# Patient Record
Sex: Male | Born: 1935 | Race: Black or African American | Hispanic: No | Smoking: Never smoker
Health system: Southern US, Community
[De-identification: ages and names within clinical notes are randomized; demographics above are authoritative.]

## PROBLEM LIST (undated history)

## (undated) DIAGNOSIS — E119 Type 2 diabetes mellitus without complications: Secondary | ICD-10-CM

## (undated) DIAGNOSIS — I10 Essential (primary) hypertension: Secondary | ICD-10-CM

## (undated) DIAGNOSIS — C801 Malignant (primary) neoplasm, unspecified: Secondary | ICD-10-CM

## (undated) DIAGNOSIS — I2510 Atherosclerotic heart disease of native coronary artery without angina pectoris: Secondary | ICD-10-CM

## (undated) DIAGNOSIS — E7800 Pure hypercholesterolemia, unspecified: Secondary | ICD-10-CM

## (undated) HISTORY — PX: CORONARY ARTERY BYPASS GRAFT: SHX141

---

## 2016-09-06 DIAGNOSIS — C61 Malignant neoplasm of prostate: Secondary | ICD-10-CM | POA: Diagnosis present

## 2016-09-07 DIAGNOSIS — C221 Intrahepatic bile duct carcinoma: Secondary | ICD-10-CM | POA: Diagnosis present

## 2016-12-17 ENCOUNTER — Observation Stay (HOSPITAL_BASED_OUTPATIENT_CLINIC_OR_DEPARTMENT_OTHER): Payer: Medicare Other

## 2016-12-17 ENCOUNTER — Encounter (HOSPITAL_BASED_OUTPATIENT_CLINIC_OR_DEPARTMENT_OTHER): Payer: Self-pay | Admitting: Emergency Medicine

## 2016-12-17 ENCOUNTER — Inpatient Hospital Stay (HOSPITAL_BASED_OUTPATIENT_CLINIC_OR_DEPARTMENT_OTHER)
Admission: EM | Admit: 2016-12-17 | Discharge: 2016-12-21 | DRG: 180 | Disposition: A | Discharge status: Skilled Nursing Facility | Payer: Medicare Other | Attending: Internal Medicine | Admitting: Internal Medicine

## 2016-12-17 ENCOUNTER — Emergency Department (HOSPITAL_BASED_OUTPATIENT_CLINIC_OR_DEPARTMENT_OTHER): Payer: Medicare Other

## 2016-12-17 DIAGNOSIS — E1100 Type 2 diabetes mellitus with hyperosmolarity without nonketotic hyperglycemic-hyperosmolar coma (NKHHC): Secondary | ICD-10-CM | POA: Diagnosis present

## 2016-12-17 DIAGNOSIS — Z681 Body mass index (BMI) 19 or less, adult: Secondary | ICD-10-CM

## 2016-12-17 DIAGNOSIS — R0602 Shortness of breath: Secondary | ICD-10-CM

## 2016-12-17 DIAGNOSIS — N183 Chronic kidney disease, stage 3 unspecified: Secondary | ICD-10-CM | POA: Diagnosis present

## 2016-12-17 DIAGNOSIS — R627 Adult failure to thrive: Secondary | ICD-10-CM | POA: Diagnosis present

## 2016-12-17 DIAGNOSIS — Z791 Long term (current) use of non-steroidal anti-inflammatories (NSAID): Secondary | ICD-10-CM

## 2016-12-17 DIAGNOSIS — I2583 Coronary atherosclerosis due to lipid rich plaque: Secondary | ICD-10-CM | POA: Diagnosis present

## 2016-12-17 DIAGNOSIS — C7801 Secondary malignant neoplasm of right lung: Secondary | ICD-10-CM | POA: Diagnosis not present

## 2016-12-17 DIAGNOSIS — I129 Hypertensive chronic kidney disease with stage 1 through stage 4 chronic kidney disease, or unspecified chronic kidney disease: Secondary | ICD-10-CM | POA: Diagnosis present

## 2016-12-17 DIAGNOSIS — R0609 Other forms of dyspnea: Secondary | ICD-10-CM | POA: Diagnosis not present

## 2016-12-17 DIAGNOSIS — I2510 Atherosclerotic heart disease of native coronary artery without angina pectoris: Secondary | ICD-10-CM | POA: Diagnosis present

## 2016-12-17 DIAGNOSIS — R918 Other nonspecific abnormal finding of lung field: Secondary | ICD-10-CM

## 2016-12-17 DIAGNOSIS — D631 Anemia in chronic kidney disease: Secondary | ICD-10-CM | POA: Diagnosis present

## 2016-12-17 DIAGNOSIS — R413 Other amnesia: Secondary | ICD-10-CM | POA: Diagnosis present

## 2016-12-17 DIAGNOSIS — E43 Unspecified severe protein-calorie malnutrition: Secondary | ICD-10-CM | POA: Diagnosis present

## 2016-12-17 DIAGNOSIS — Z515 Encounter for palliative care: Secondary | ICD-10-CM | POA: Diagnosis not present

## 2016-12-17 DIAGNOSIS — Z79899 Other long term (current) drug therapy: Secondary | ICD-10-CM

## 2016-12-17 DIAGNOSIS — R531 Weakness: Secondary | ICD-10-CM

## 2016-12-17 DIAGNOSIS — C240 Malignant neoplasm of extrahepatic bile duct: Secondary | ICD-10-CM | POA: Diagnosis present

## 2016-12-17 DIAGNOSIS — C7802 Secondary malignant neoplasm of left lung: Secondary | ICD-10-CM | POA: Diagnosis present

## 2016-12-17 DIAGNOSIS — Z801 Family history of malignant neoplasm of trachea, bronchus and lung: Secondary | ICD-10-CM

## 2016-12-17 DIAGNOSIS — E1122 Type 2 diabetes mellitus with diabetic chronic kidney disease: Secondary | ICD-10-CM | POA: Diagnosis present

## 2016-12-17 DIAGNOSIS — Z7984 Long term (current) use of oral hypoglycemic drugs: Secondary | ICD-10-CM

## 2016-12-17 DIAGNOSIS — I10 Essential (primary) hypertension: Secondary | ICD-10-CM | POA: Diagnosis present

## 2016-12-17 DIAGNOSIS — C61 Malignant neoplasm of prostate: Secondary | ICD-10-CM | POA: Diagnosis present

## 2016-12-17 DIAGNOSIS — C23 Malignant neoplasm of gallbladder: Secondary | ICD-10-CM | POA: Diagnosis present

## 2016-12-17 DIAGNOSIS — Z951 Presence of aortocoronary bypass graft: Secondary | ICD-10-CM

## 2016-12-17 DIAGNOSIS — D649 Anemia, unspecified: Secondary | ICD-10-CM | POA: Diagnosis present

## 2016-12-17 DIAGNOSIS — Z87891 Personal history of nicotine dependence: Secondary | ICD-10-CM

## 2016-12-17 DIAGNOSIS — Z7982 Long term (current) use of aspirin: Secondary | ICD-10-CM

## 2016-12-17 DIAGNOSIS — E7800 Pure hypercholesterolemia, unspecified: Secondary | ICD-10-CM | POA: Diagnosis present

## 2016-12-17 DIAGNOSIS — J439 Emphysema, unspecified: Secondary | ICD-10-CM | POA: Diagnosis present

## 2016-12-17 DIAGNOSIS — R778 Other specified abnormalities of plasma proteins: Secondary | ICD-10-CM | POA: Diagnosis present

## 2016-12-17 DIAGNOSIS — C221 Intrahepatic bile duct carcinoma: Secondary | ICD-10-CM | POA: Diagnosis present

## 2016-12-17 DIAGNOSIS — R7989 Other specified abnormal findings of blood chemistry: Secondary | ICD-10-CM

## 2016-12-17 HISTORY — DX: Type 2 diabetes mellitus without complications: E11.9

## 2016-12-17 HISTORY — DX: Pure hypercholesterolemia, unspecified: E78.00

## 2016-12-17 HISTORY — DX: Atherosclerotic heart disease of native coronary artery without angina pectoris: I25.10

## 2016-12-17 HISTORY — DX: Essential (primary) hypertension: I10

## 2016-12-17 HISTORY — DX: Malignant (primary) neoplasm, unspecified: C80.1

## 2016-12-17 LAB — CBC WITH DIFFERENTIAL/PLATELET
BASOS ABS: 0 10*3/uL (ref 0.0–0.1)
Basophils Relative: 0 %
Eosinophils Absolute: 0.3 10*3/uL (ref 0.0–0.7)
Eosinophils Relative: 5 %
HEMATOCRIT: 31.8 % — AB (ref 39.0–52.0)
Hemoglobin: 10.2 g/dL — ABNORMAL LOW (ref 13.0–17.0)
LYMPHS PCT: 15 %
Lymphs Abs: 1 10*3/uL (ref 0.7–4.0)
MCH: 29.4 pg (ref 26.0–34.0)
MCHC: 32.1 g/dL (ref 30.0–36.0)
MCV: 91.6 fL (ref 78.0–100.0)
MONO ABS: 0.6 10*3/uL (ref 0.1–1.0)
Monocytes Relative: 8 %
NEUTROS ABS: 5.1 10*3/uL (ref 1.7–7.7)
Neutrophils Relative %: 72 %
Platelets: 158 10*3/uL (ref 150–400)
RBC: 3.47 MIL/uL — AB (ref 4.22–5.81)
RDW: 14.1 % (ref 11.5–15.5)
WBC: 7 10*3/uL (ref 4.0–10.5)

## 2016-12-17 LAB — COMPREHENSIVE METABOLIC PANEL
ALBUMIN: 3.5 g/dL (ref 3.5–5.0)
ALT: 40 U/L (ref 17–63)
AST: 46 U/L — AB (ref 15–41)
Alkaline Phosphatase: 97 U/L (ref 38–126)
Anion gap: 11 (ref 5–15)
BUN: 24 mg/dL — AB (ref 6–20)
CHLORIDE: 95 mmol/L — AB (ref 101–111)
CO2: 28 mmol/L (ref 22–32)
Calcium: 9.3 mg/dL (ref 8.9–10.3)
Creatinine, Ser: 1.24 mg/dL (ref 0.61–1.24)
GFR calc Af Amer: 58 mL/min — ABNORMAL LOW (ref >60)
GFR, EST NON AFRICAN AMERICAN: 50 mL/min — AB (ref >60)
Glucose, Bld: 263 mg/dL — ABNORMAL HIGH (ref 65–99)
POTASSIUM: 4.7 mmol/L (ref 3.5–5.1)
SODIUM: 134 mmol/L — AB (ref 135–145)
Total Bilirubin: 0.5 mg/dL (ref 0.3–1.2)
Total Protein: 7.7 g/dL (ref 6.5–8.1)

## 2016-12-17 LAB — BASIC METABOLIC PANEL
BUN: 24 — AB (ref 4–21)
Creatinine: 1.2 (ref 0.6–1.3)
Glucose: 263
Potassium: 4.7 (ref 3.4–5.3)
Sodium: 134 — AB (ref 137–147)

## 2016-12-17 LAB — BRAIN NATRIURETIC PEPTIDE: B Natriuretic Peptide: 87.3 pg/mL (ref 0.0–100.0)

## 2016-12-17 LAB — CBG MONITORING, ED: GLUCOSE-CAPILLARY: 257 mg/dL — AB (ref 65–99)

## 2016-12-17 LAB — TROPONIN I: Troponin I: 0.1 ng/mL (ref <0.03)

## 2016-12-17 LAB — CBC AND DIFFERENTIAL
HCT: 32 — AB (ref 41–53)
HEMOGLOBIN: 10.2 — AB (ref 13.5–17.5)
PLATELETS: 158 (ref 150–399)
WBC: 7

## 2016-12-17 LAB — HEPATIC FUNCTION PANEL: BILIRUBIN, TOTAL: 0.5

## 2016-12-17 LAB — LIPASE, BLOOD: LIPASE: 110 U/L — AB (ref 11–51)

## 2016-12-17 LAB — GLUCOSE, CAPILLARY: GLUCOSE-CAPILLARY: 152 mg/dL — AB (ref 65–99)

## 2016-12-17 MED ORDER — IOPAMIDOL (ISOVUE-300) INJECTION 61%
100.0000 mL | Freq: Once | INTRAVENOUS | Status: AC | PRN
  Administered 2016-12-17: 100 mL via INTRAVENOUS

## 2016-12-17 MED ORDER — SODIUM CHLORIDE 0.9 % IV BOLUS (SEPSIS)
500.0000 mL | Freq: Once | INTRAVENOUS | Status: AC
  Administered 2016-12-17: 500 mL via INTRAVENOUS

## 2016-12-17 MED ORDER — ASPIRIN 81 MG PO CHEW
324.0000 mg | CHEWABLE_TABLET | Freq: Once | ORAL | Status: AC
  Administered 2016-12-17: 324 mg via ORAL
  Filled 2016-12-17: qty 4

## 2016-12-17 NOTE — ED Triage Notes (Signed)
Pt sent from UC for possible pneumonia. Caregivers advise pt has exertional SOB x several days, denies fever. Also advise pt blood sugars have been high.

## 2016-12-17 NOTE — Progress Notes (Signed)
83 yo M with CAD s/p CABG ~2000, HTN, NIDDM, prostate cancer, recently rising PSA and in last 6 months new dx cholangiocarcinoma who presents with weakness.  In early May, patient saw his Onc in [place] (this is from [place]) and declined chemo, said he wanted to be with family in [place].  Since has moved here, established with Dr. Morene Rankins?  In last few days, family have noticed he is more tired, weak, confused.  Today brought him in.  BP 100/77   Pulse (!) 58   Temp 97.9 F (36.6 C) (Oral)   Resp 18   SpO2 96%   Na 134, K 4.7, Cr 1.2, Glucose 263, AST 46, ALT normal, WBC 7K, Hgb 10.2 Lipase 110 Troponin 0.1 BNP normal  No real focal symptoms.  D/w Cardiology who rec'd medical admission and observation at [facility] in case troponin rises.  Likely no invasive strategy given age, cancer.  ED will get CT abd before transfer.    To tele, OBS status.

## 2016-12-17 NOTE — ED Notes (Addendum)
Pt's family reports pt had recent move from [place] to [place] due to bile duct cancer dx. Family reports that patient has been in [place] x 2 weeks. Sts pt becomes short of breath and weak after brushing teeth and hair. Sts that pt has been more confused recently as well. Also reports trouble with blood sugars. Sts that today it has been approximately 400 prior to going to Urgent Care. Pt diagnosed with pneumonia at Urgent Care and sent to ED for further eval. Pt A&O x 4 at this time. Family reports pt recent Hgb of 5 in [place] has been giving patient iron to try to build up blood counts.

## 2016-12-17 NOTE — ED Notes (Signed)
Pts daughtercalled at his request and informed of plans to admit to [place] at [facility].

## 2016-12-17 NOTE — ED Provider Notes (Signed)
Emergency Department Provider Note  By signing my name below, I, Marcello Moores, attest that this documentation has been prepared under the direction and in the presence of Long, Wonda Olds, MD. Electronically Signed: Marcello Moores, ED Scribe. 12/17/16. 7:08 PM.  I have reviewed the triage vital signs and the nursing notes.   HISTORY  Chief Complaint Shortness of Breath  HPI Comments: Willie Bruce is a 83 y.o. male, previously seen at [facility], who presents to the Emergency Department complaining of constant, severe SOB with associated confusion that began worsening two days ago. Per daughter, the SOB causes him discomfort when he's performing regular tasks like brushing his teeth and hair. The pt also complains of chronic hypertension. He indicates that he recently moved here from [place], where he was diagnosed with prostate and stomach cancer by [facility] at [facility]. He has yet to undergo chemotherapy and radiation. Per daughter, the pt had one blood transfusion due to his triple heart bypass surgery from 4 weeks ago. The pt denies being on any blood thinners. The pt denies chest pain, abdominal pain, melena, chills, and hematochezia.   Past Medical History:  Diagnosis Date  . Cancer ([place])   . Coronary artery disease   . Diabetes mellitus without complication ([place])   . High cholesterol   . Hypertension     Patient Active Problem List   Diagnosis Date Noted  . Atypical pneumonia 12/18/2016  . CKD (chronic kidney disease), stage III 12/18/2016  . Normocytic anemia 12/18/2016  . Elevated troponin 12/18/2016  . Type 2 diabetes mellitus with hyperosmolarity without coma, without long-term current use of insulin ([place]) 12/18/2016  . Essential hypertension 12/18/2016  . Coronary artery disease due to lipid rich plaque 12/18/2016  . Weakness 12/17/2016  . Cholangiocarcinoma ([place]) 09/07/2016  . Prostate cancer ([place]) 09/06/2016    Past Surgical History:   Procedure Laterality Date  . CORONARY ARTERY BYPASS GRAFT        Allergies Patient has no known allergies.  Family History  Problem Relation Age of Onset  . Lung cancer Sister     Social History Social History  Substance Use Topics  . Smoking status: Never Smoker  . Smokeless tobacco: Never Used  . Alcohol use Not on file    Review of Systems  Constitutional: No fever/chills Eyes: No visual changes. ENT: No sore throat. Cardiovascular: Denies chest pain. Respiratory: Positive shortness of breath. Gastrointestinal: No abdominal pain.  No nausea, no vomiting.  No diarrhea.  No constipation. No melena. No hematochezia.  Genitourinary: Negative for dysuria. Musculoskeletal: Negative for back pain. Skin: Negative for rash. Neurological: Negative for headaches, focal weakness or numbness. Positive for confusion.  10-point ROS otherwise negative.  ____________________________________________   PHYSICAL EXAM:  VITAL SIGNS: ED Triage Vitals  Enc Vitals Group     BP 12/17/16 1808 (!) 141/62     Pulse Rate 12/17/16 1808 62     Resp 12/17/16 1808 (!) 24     Temp 12/17/16 1808 97.9 F (36.6 C)     Temp Source 12/17/16 1808 Oral     SpO2 12/17/16 1808 99 %     Pain Score 12/17/16 1807 0   Constitutional: Alert and oriented. Well appearing and in no acute distress. Eyes: Conjunctivae are normal. Head: Atraumatic. Nose: No congestion/rhinnorhea. Mouth/Throat: Mucous membranes are moist.  Oropharynx non-erythematous. Neck: No stridor.  Cardiovascular: Normal rate, regular rhythm. Good peripheral circulation. Grossly normal heart sounds.   Respiratory: Normal respiratory effort.  No retractions. Lungs CTAB. Gastrointestinal: Soft and nontender. No distention.  Musculoskeletal: No lower extremity tenderness nor edema. No gross deformities of extremities. Neurologic:  Normal speech and language. No gross focal neurologic deficits are appreciated.  Skin:  Skin is warm,  dry and intact. No rash noted.  ____________________________________________  DIAGNOSTIC STUDIES: Oxygen Saturation is 99% on RA, normal by my interpretation.   COORDINATION OF CARE: 6:50 PM-Discussed next steps with pt. Pt verbalized understanding and is agreeable with the plan.    LABS (all labs ordered are listed, but only abnormal results are displayed)  Labs Reviewed  COMPREHENSIVE METABOLIC PANEL - Abnormal; Notable for the following:       Result Value   Sodium 134 (*)    Chloride 95 (*)    Glucose, Bld 263 (*)    BUN 24 (*)    AST 46 (*)    GFR calc non Af Amer 50 (*)    GFR calc Af Amer 58 (*)    All other components within normal limits  LIPASE, BLOOD - Abnormal; Notable for the following:    Lipase 110 (*)    All other components within normal limits  CBC WITH DIFFERENTIAL/PLATELET - Abnormal; Notable for the following:    RBC 3.47 (*)    Hemoglobin 10.2 (*)    HCT 31.8 (*)    All other components within normal limits  TROPONIN I - Abnormal; Notable for the following:    Troponin I 0.10 (*)    All other components within normal limits  GLUCOSE, CAPILLARY - Abnormal; Notable for the following:    Glucose-Capillary 152 (*)    All other components within normal limits  TROPONIN I - Abnormal; Notable for the following:    Troponin I 0.09 (*)    All other components within normal limits  TROPONIN I - Abnormal; Notable for the following:    Troponin I 0.08 (*)    All other components within normal limits  CBC - Abnormal; Notable for the following:    RBC 3.19 (*)    Hemoglobin 8.9 (*)    HCT 28.4 (*)    Platelets 131 (*)    All other components within normal limits  GLUCOSE, CAPILLARY - Abnormal; Notable for the following:    Glucose-Capillary 203 (*)    All other components within normal limits  URINALYSIS, ROUTINE W REFLEX MICROSCOPIC - Abnormal; Notable for the following:    Glucose, UA 500 (*)    All other components within normal limits  CBG  MONITORING, ED - Abnormal; Notable for the following:    Glucose-Capillary 257 (*)    All other components within normal limits  BRAIN NATRIURETIC PEPTIDE  URINALYSIS, ROUTINE W REFLEX MICROSCOPIC  STREP PNEUMONIAE URINARY ANTIGEN  LEGIONELLA PNEUMOPHILA SEROGP 1 UR AG   ____________________________________________  EKG   EKG Interpretation  Date/Time:  Sunday December 17 2016 18:14:27 EDT Ventricular Rate:  62 PR Interval:    QRS Duration: 110 QT Interval:  425 QTC Calculation: 432 R Axis:   87 Text Interpretation:  Sinus rhythm Borderline right axis deviation No STEMI. No old for comparison.  Confirmed by Nanda Quinton [phone]) on 12/17/2016 6:40:32 PM       ____________________________________________  RADIOLOGY  Dg Chest 2 View  Result Date: 12/17/2016 CLINICAL DATA:  Exertional shortness breath for several days. EXAM: CHEST  2 VIEW COMPARISON:  None. FINDINGS: Normal sized heart. Post CABG changes. Mild patchy opacities scattered throughout the right lung.a mall poorly defined nodular  opacity in the left mid lung zone. Unremarkable bones. IMPRESSION: Mild patchy opacities in the right lung and small poorly defined nodular opacity in the left mid lung zone. These could represent infectious or post infectious changes. A chest CT with contrast may provide useful additional information. Electronically Signed   By: Claudie Revering M.D.   On: 12/17/2016 21:41   Ct Abdomen Pelvis W Contrast  Result Date: 12/17/2016 CLINICAL DATA:  Exertional shortness of breath, history of recently diagnosed cholangiocarcinoma EXAM: CT ABDOMEN AND PELVIS WITH CONTRAST TECHNIQUE: Multidetector CT imaging of the abdomen and pelvis was performed using the standard protocol following bolus administration of intravenous contrast. CONTRAST:  114mL ISOVUE-300 IOPAMIDOL (ISOVUE-300) INJECTION 61% COMPARISON:  None. FINDINGS: Lower chest: Lung bases demonstrate multiple, greater than 6, peripherally based nodules  within the right lower lobe, right middle lobe, lingula and left lower lobe. No pleural effusion or focal consolidation is seen. The heart is nonenlarged. Coronary artery calcifications. Hepatobiliary: Vague hypodense somewhat branching appearing structures in the right hepatic lobe. No focal mass is seen. Pneumobilia a with presence of biliary stent. Fluid or debris in the distal portion of the stent at the duodenum. Small air in the gallbladder. No calcified stones. Ill-defined soft tissue thickening/hypodensity at the distal portion of biliary stent Pancreas: Pancreatic duct is enlarged. Slight bloody appearance at the head of pancreas and second portion of duodenum Spleen: Multiple hypodense subcentimeter splenic lesions. Adrenals/Urinary Tract: Adrenal glands are within normal limits. The kidneys show no hydronephrosis. Cyst upper pole left kidney measuring 2.5 cm. Possible stones versus cortical calcifications in the lower pole of the left kidney. 15 mm cyst lower pole of the right kidney. Parapelvic cysts in the upper pole of the right kidney. Bladder unremarkable. Stomach/Bowel: Stomach is nonenlarged. No dilated small bowel. No colon wall thickening. Normal appendix. Vascular/Lymphatic: Extensive atherosclerotic calcifications of the aorta. 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 16 mm peripancreatic lymph node. No significantly enlarged pelvic nodes. Reproductive: Enlarged prostate with mass effect on the posterior bladder. Metallic densities at the prostate gland. Other: No free air or free fluid.  Small fat in the umbilicus. Musculoskeletal: 11 mm anterolisthesis of L4 on L5 with fusion of the vertebral bodies. Bilateral pars defect at L4. Retrolisthesis of L5 on S1 with degenerative changes. No suspicious bone lesion. IMPRESSION: 1. Multiple subpleural bilateral lower lobe, right middle lobe and lingular pulmonary nodules suspicious for respiratory infection or atypical  pneumonia. Metastatic nodules also possible given history. Non-contrast chest CT at 3-6 months is recommended. If the nodules are stable at time of repeat CT, then future CT at 18-24 months (from today's scan) is considered optional for low-risk patients, but is recommended for high-risk patients. This recommendation follows the consensus statement: Guidelines for Management of Incidental Pulmonary Nodules Detected on CT Images: From the Fleischner Society 2017; Radiology 2017; 284:228-243. 2. Pneumobilia with presence of biliary stent. Small amount of debris or tissue in the distal portion of the stent. Ill-defined hypodensity around the distal stent/head of pancreas, which may relate to history of cholangiocarcinoma. Pancreatic duct is enlarged. 3. Vague peripheral possibly branching hypodensities mostly in the right hepatic lobe, could relate to distal ductal dilatation. 4. Subcentimeter splenic lesions too small to further characterize. 5. Aneurysmal dilatation of the iliac arteries, on the right up to 2.9 cm and on the left up to 1.9 cm. 6. Enlarged heterogenous prostate gland with mass effect on the posterior bladder. 7. 11 mm anterolisthesis of  L4 on L5 with bilateral pars defect at L4 Electronically Signed   By: Donavan Foil M.D.   On: 12/17/2016 22:14    ____________________________________________   PROCEDURES  Procedure(s) performed:   Procedures  None ____________________________________________   INITIAL IMPRESSION / ASSESSMENT AND PLAN / ED COURSE  Pertinent labs & imaging results that were available during my care of the patient were reviewed by me and considered in my medical decision making (see chart for details).  Patient presents to the emergency department for evaluation of worsening fatigue, dyspnea, confusion per family. He denies any chest pain. He was diagnosed with prostate cancer many years ago and recently diagnosed with Cholangiocarcinoma with mass in the distal  common bile duct. He moved down to [place] from [place] to be closer to family. Not currently on chemotherapy or radiation. He put that on hold for the move. Also with history of GI bleed and blood transfusion in the last 6 weeks per family. EKG with no acute ischemia. Abdomen is soft and non-tender to palpation.    Troponin found to be elevated to 0.10. Normal kidney function. Patient with some mild dyspnea but no chest pain. EKG repeated and unchanged. Patient given full dose aspirin and will discuss with cardiology regarding admission. He does have history of CABG 20 years ago in [place] but, as above, is new to the area and does not have a PCP or Cardiologist here. I was able to find records on Epic from [facility] in [place] and linked those in [place].   08:12 PM Spoke with cardiology Dr. Jeannine Boga who advises medical management and admission to hospitalist at this time. The patient has active malignancy and advanced age it makes him a poor candidate for PCI. Plan for admission to [facility] for enzyme trending. He does not recommend heparin at this time. Patient was given aspirin previously. Will contact the hospitalist.   Discussed patient's case with Hospitalist, Dr. Loleta Books. Patient and family (if present) updated with plan. Care transferred to Hospitalist service.  I reviewed all nursing notes, vitals, pertinent old records, EKGs, labs, imaging (as available).  ____________________________________________  FINAL CLINICAL IMPRESSION(S) / ED DIAGNOSES  Final diagnoses:  Dyspnea on exertion  Generalized weakness  Elevated troponin     MEDICATIONS GIVEN DURING THIS VISIT:  Medications  pneumococcal 23 valent vaccine (PNU-IMMUNE) injection 0.5 mL (not administered)  aspirin EC tablet 81 mg (not administered)  atorvastatin (LIPITOR) tablet 10 mg (not administered)  bicalutamide (CASODEX) tablet 50 mg (not administered)  gabapentin (NEURONTIN) capsule 100 mg (100 mg Oral Given 12/18/16  0302)  metoprolol tartrate (LOPRESSOR) tablet 100 mg (not administered)  insulin aspart (novoLOG) injection 0-9 Units (3 Units Subcutaneous Given 12/18/16 0854)  insulin aspart (novoLOG) injection 0-5 Units (0 Units Subcutaneous Not Given 12/18/16 0115)  cefTRIAXone (ROCEPHIN) 1 g in dextrose 5 % 50 mL IVPB (0 g Intravenous Stopped 12/18/16 0331)  azithromycin (ZITHROMAX) tablet 500 mg (500 mg Oral Given 12/18/16 0302)  enoxaparin (LOVENOX) injection 40 mg (not administered)  0.9 %  sodium chloride infusion (100 mL/hr Intravenous New Bag/Given 12/18/16 0301)  acetaminophen (TYLENOL) tablet 650 mg (not administered)    Or  acetaminophen (TYLENOL) suppository 650 mg (not administered)  ondansetron (ZOFRAN) tablet 4 mg (not administered)    Or  ondansetron (ZOFRAN) injection 4 mg (not administered)  feeding supplement (ENSURE ENLIVE) (ENSURE ENLIVE) liquid 237 mL (not administered)  sodium chloride 0.9 % bolus 500 mL (0 mLs Intravenous Stopped 12/17/16 1930)  aspirin  chewable tablet 324 mg (324 mg Oral Given 12/17/16 1942)  iopamidol (ISOVUE-300) 61 % injection 100 mL (100 mLs Intravenous Contrast Given 12/17/16 2107)    I personally performed the services described in this documentation, which was scribed in my presence. The recorded information has been reviewed and is accurate.    Note:  This document was prepared using Dragon voice recognition software and may include unintentional dictation errors.  Nanda Quinton, MD Emergency Medicine    Long, Wonda Olds, MD 12/18/16 1002

## 2016-12-17 NOTE — ED Notes (Signed)
Pt to XR via wheelchair

## 2016-12-17 NOTE — ED Notes (Signed)
Lab called and Troponin elevated at 0.10 Dr. Laverta [place] and primary nurse aware

## 2016-12-18 ENCOUNTER — Observation Stay (HOSPITAL_COMMUNITY): Payer: Medicare Other

## 2016-12-18 ENCOUNTER — Observation Stay (HOSPITAL_BASED_OUTPATIENT_CLINIC_OR_DEPARTMENT_OTHER): Payer: Medicare Other

## 2016-12-18 ENCOUNTER — Encounter (HOSPITAL_COMMUNITY): Payer: Self-pay | Admitting: Family Medicine

## 2016-12-18 DIAGNOSIS — I10 Essential (primary) hypertension: Secondary | ICD-10-CM | POA: Diagnosis not present

## 2016-12-18 DIAGNOSIS — N183 Chronic kidney disease, stage 3 unspecified: Secondary | ICD-10-CM | POA: Diagnosis present

## 2016-12-18 DIAGNOSIS — C221 Intrahepatic bile duct carcinoma: Secondary | ICD-10-CM

## 2016-12-18 DIAGNOSIS — R918 Other nonspecific abnormal finding of lung field: Secondary | ICD-10-CM | POA: Diagnosis not present

## 2016-12-18 DIAGNOSIS — D631 Anemia in chronic kidney disease: Secondary | ICD-10-CM | POA: Diagnosis present

## 2016-12-18 DIAGNOSIS — J439 Emphysema, unspecified: Secondary | ICD-10-CM | POA: Diagnosis present

## 2016-12-18 DIAGNOSIS — R748 Abnormal levels of other serum enzymes: Secondary | ICD-10-CM

## 2016-12-18 DIAGNOSIS — C23 Malignant neoplasm of gallbladder: Secondary | ICD-10-CM | POA: Diagnosis present

## 2016-12-18 DIAGNOSIS — R531 Weakness: Secondary | ICD-10-CM

## 2016-12-18 DIAGNOSIS — C7802 Secondary malignant neoplasm of left lung: Secondary | ICD-10-CM | POA: Diagnosis present

## 2016-12-18 DIAGNOSIS — I2510 Atherosclerotic heart disease of native coronary artery without angina pectoris: Secondary | ICD-10-CM

## 2016-12-18 DIAGNOSIS — J189 Pneumonia, unspecified organism: Secondary | ICD-10-CM

## 2016-12-18 DIAGNOSIS — Z801 Family history of malignant neoplasm of trachea, bronchus and lung: Secondary | ICD-10-CM | POA: Diagnosis not present

## 2016-12-18 DIAGNOSIS — Z681 Body mass index (BMI) 19 or less, adult: Secondary | ICD-10-CM | POA: Diagnosis not present

## 2016-12-18 DIAGNOSIS — C7800 Secondary malignant neoplasm of unspecified lung: Secondary | ICD-10-CM | POA: Diagnosis not present

## 2016-12-18 DIAGNOSIS — E1122 Type 2 diabetes mellitus with diabetic chronic kidney disease: Secondary | ICD-10-CM | POA: Diagnosis present

## 2016-12-18 DIAGNOSIS — E119 Type 2 diabetes mellitus without complications: Secondary | ICD-10-CM | POA: Diagnosis not present

## 2016-12-18 DIAGNOSIS — R0609 Other forms of dyspnea: Secondary | ICD-10-CM

## 2016-12-18 DIAGNOSIS — E1100 Type 2 diabetes mellitus with hyperosmolarity without nonketotic hyperglycemic-hyperosmolar coma (NKHHC): Secondary | ICD-10-CM | POA: Diagnosis present

## 2016-12-18 DIAGNOSIS — I2583 Coronary atherosclerosis due to lipid rich plaque: Secondary | ICD-10-CM | POA: Diagnosis present

## 2016-12-18 DIAGNOSIS — R7989 Other specified abnormal findings of blood chemistry: Secondary | ICD-10-CM

## 2016-12-18 DIAGNOSIS — C220 Liver cell carcinoma: Secondary | ICD-10-CM | POA: Diagnosis not present

## 2016-12-18 DIAGNOSIS — Z7984 Long term (current) use of oral hypoglycemic drugs: Secondary | ICD-10-CM | POA: Diagnosis not present

## 2016-12-18 DIAGNOSIS — R0600 Dyspnea, unspecified: Secondary | ICD-10-CM | POA: Diagnosis not present

## 2016-12-18 DIAGNOSIS — E7800 Pure hypercholesterolemia, unspecified: Secondary | ICD-10-CM | POA: Diagnosis present

## 2016-12-18 DIAGNOSIS — D649 Anemia, unspecified: Secondary | ICD-10-CM | POA: Diagnosis present

## 2016-12-18 DIAGNOSIS — Z951 Presence of aortocoronary bypass graft: Secondary | ICD-10-CM | POA: Diagnosis not present

## 2016-12-18 DIAGNOSIS — C61 Malignant neoplasm of prostate: Secondary | ICD-10-CM | POA: Diagnosis present

## 2016-12-18 DIAGNOSIS — I129 Hypertensive chronic kidney disease with stage 1 through stage 4 chronic kidney disease, or unspecified chronic kidney disease: Secondary | ICD-10-CM | POA: Diagnosis present

## 2016-12-18 DIAGNOSIS — C240 Malignant neoplasm of extrahepatic bile duct: Secondary | ICD-10-CM | POA: Diagnosis present

## 2016-12-18 DIAGNOSIS — Z87891 Personal history of nicotine dependence: Secondary | ICD-10-CM | POA: Diagnosis not present

## 2016-12-18 DIAGNOSIS — Z515 Encounter for palliative care: Secondary | ICD-10-CM | POA: Diagnosis not present

## 2016-12-18 DIAGNOSIS — E43 Unspecified severe protein-calorie malnutrition: Secondary | ICD-10-CM | POA: Diagnosis present

## 2016-12-18 DIAGNOSIS — R413 Other amnesia: Secondary | ICD-10-CM | POA: Diagnosis present

## 2016-12-18 DIAGNOSIS — R778 Other specified abnormalities of plasma proteins: Secondary | ICD-10-CM | POA: Diagnosis present

## 2016-12-18 DIAGNOSIS — C7801 Secondary malignant neoplasm of right lung: Secondary | ICD-10-CM | POA: Diagnosis present

## 2016-12-18 DIAGNOSIS — R627 Adult failure to thrive: Secondary | ICD-10-CM | POA: Diagnosis present

## 2016-12-18 LAB — TROPONIN I
Troponin I: 0.09 ng/mL (ref <0.03)
Troponin I: 0.08 ng/mL (ref <0.03)

## 2016-12-18 LAB — CBC
HCT: 28.4 % — ABNORMAL LOW (ref 39.0–52.0)
Hemoglobin: 8.9 g/dL — ABNORMAL LOW (ref 13.0–17.0)
MCH: 27.9 pg (ref 26.0–34.0)
MCHC: 31.3 g/dL (ref 30.0–36.0)
MCV: 89 fL (ref 78.0–100.0)
PLATELETS: 131 10*3/uL — AB (ref 150–400)
RBC: 3.19 MIL/uL — AB (ref 4.22–5.81)
RDW: 14.3 % (ref 11.5–15.5)
WBC: 6.3 10*3/uL (ref 4.0–10.5)

## 2016-12-18 LAB — URINALYSIS, ROUTINE W REFLEX MICROSCOPIC
Bilirubin Urine: NEGATIVE
Glucose, UA: 500 mg/dL — AB
HGB URINE DIPSTICK: NEGATIVE
Ketones, ur: NEGATIVE mg/dL
Leukocytes, UA: NEGATIVE
Nitrite: NEGATIVE
PROTEIN: NEGATIVE mg/dL
Specific Gravity, Urine: 1.015 (ref 1.005–1.030)
pH: 5.5 (ref 5.0–8.0)

## 2016-12-18 LAB — GLUCOSE, CAPILLARY
GLUCOSE-CAPILLARY: 204 mg/dL — AB (ref 65–99)
GLUCOSE-CAPILLARY: 87 mg/dL (ref 65–99)
Glucose-Capillary: 203 mg/dL — ABNORMAL HIGH (ref 65–99)
Glucose-Capillary: 222 mg/dL — ABNORMAL HIGH (ref 65–99)

## 2016-12-18 LAB — ECHOCARDIOGRAM COMPLETE
Height: 71 in
WEIGHTICAEL: 2324.8 [oz_av]

## 2016-12-18 LAB — CBC AND DIFFERENTIAL
HEMATOCRIT: 28 — AB (ref 41–53)
Hemoglobin: 8.9 — AB (ref 13.5–17.5)
PLATELETS: 131 — AB (ref 150–399)
WBC: 6.3

## 2016-12-18 LAB — STREP PNEUMONIAE URINARY ANTIGEN: Strep Pneumo Urinary Antigen: NEGATIVE

## 2016-12-18 MED ORDER — ENSURE ENLIVE PO LIQD
237.0000 mL | Freq: Two times a day (BID) | ORAL | Status: DC
  Administered 2016-12-18 – 2016-12-21 (×6): 237 mL via ORAL

## 2016-12-18 MED ORDER — ACETAMINOPHEN 650 MG RE SUPP: 650.0000 mg | Freq: Four times a day (QID) | RECTAL | Status: DC | PRN

## 2016-12-18 MED ORDER — BICALUTAMIDE 50 MG PO TABS
50.0000 mg | ORAL_TABLET | Freq: Every day | ORAL | Status: DC
  Administered 2016-12-18 – 2016-12-21 (×4): 50 mg via ORAL
  Filled 2016-12-18 (×4): qty 1

## 2016-12-18 MED ORDER — ONDANSETRON HCL 4 MG/2ML IJ SOLN: 4.0000 mg | Freq: Four times a day (QID) | INTRAMUSCULAR | Status: DC | PRN

## 2016-12-18 MED ORDER — SODIUM CHLORIDE 0.9 % IV SOLN
INTRAVENOUS | Status: DC
  Administered 2016-12-18: 100 mL/h via INTRAVENOUS

## 2016-12-18 MED ORDER — GABAPENTIN 100 MG PO CAPS
100.0000 mg | ORAL_CAPSULE | Freq: Every day | ORAL | Status: DC
  Administered 2016-12-18 – 2016-12-20 (×4): 100 mg via ORAL
  Filled 2016-12-18 (×4): qty 1

## 2016-12-18 MED ORDER — METOPROLOL TARTRATE 100 MG PO TABS
100.0000 mg | ORAL_TABLET | Freq: Every day | ORAL | Status: DC
Start: 2016-12-18 — End: 2016-12-21
  Administered 2016-12-18 – 2016-12-21 (×4): 100 mg via ORAL
  Filled 2016-12-18 (×4): qty 1

## 2016-12-18 MED ORDER — DEXTROSE 5 % IV SOLN
1.0000 g | INTRAVENOUS | Status: DC
  Administered 2016-12-18 – 2016-12-20 (×4): 1 g via INTRAVENOUS
  Filled 2016-12-18 (×5): qty 10

## 2016-12-18 MED ORDER — INSULIN ASPART 100 UNIT/ML ~~LOC~~ SOLN
0.0000 [IU] | Freq: Every day | SUBCUTANEOUS | Status: DC
  Administered 2016-12-20: 3 [IU] via SUBCUTANEOUS

## 2016-12-18 MED ORDER — ENOXAPARIN SODIUM 40 MG/0.4ML ~~LOC~~ SOLN
40.0000 mg | SUBCUTANEOUS | Status: DC
  Administered 2016-12-18 – 2016-12-21 (×4): 40 mg via SUBCUTANEOUS
  Filled 2016-12-18 (×4): qty 0.4

## 2016-12-18 MED ORDER — IOPAMIDOL (ISOVUE-370) INJECTION 76%
INTRAVENOUS | Status: AC
  Administered 2016-12-18: 80 mL
  Filled 2016-12-18: qty 100

## 2016-12-18 MED ORDER — AZITHROMYCIN 500 MG PO TABS
500.0000 mg | ORAL_TABLET | Freq: Every day | ORAL | Status: DC
  Administered 2016-12-18 – 2016-12-21 (×3): 500 mg via ORAL
  Filled 2016-12-18 (×5): qty 1

## 2016-12-18 MED ORDER — INSULIN ASPART 100 UNIT/ML ~~LOC~~ SOLN
0.0000 [IU] | Freq: Three times a day (TID) | SUBCUTANEOUS | Status: DC
Start: 2016-12-18 — End: 2016-12-21
  Administered 2016-12-18 (×3): 3 [IU] via SUBCUTANEOUS
  Administered 2016-12-19: 7 [IU] via SUBCUTANEOUS
  Administered 2016-12-19: 5 [IU] via SUBCUTANEOUS
  Administered 2016-12-19: 1 [IU] via SUBCUTANEOUS
  Administered 2016-12-20: 2 [IU] via SUBCUTANEOUS
  Administered 2016-12-20: 3 [IU] via SUBCUTANEOUS
  Administered 2016-12-20 – 2016-12-21 (×2): 9 [IU] via SUBCUTANEOUS
  Administered 2016-12-21: 3 [IU] via SUBCUTANEOUS

## 2016-12-18 MED ORDER — PNEUMOCOCCAL VAC POLYVALENT 25 MCG/0.5ML IJ INJ
0.5000 mL | INJECTION | INTRAMUSCULAR | Status: DC
  Filled 2016-12-18: qty 0.5

## 2016-12-18 MED ORDER — ASPIRIN EC 81 MG PO TBEC
81.0000 mg | DELAYED_RELEASE_TABLET | Freq: Every day | ORAL | Status: DC
  Administered 2016-12-18 – 2016-12-21 (×4): 81 mg via ORAL
  Filled 2016-12-18 (×4): qty 1

## 2016-12-18 MED ORDER — ACETAMINOPHEN 325 MG PO TABS: 650.0000 mg | ORAL_TABLET | Freq: Four times a day (QID) | ORAL | Status: DC | PRN

## 2016-12-18 MED ORDER — ONDANSETRON HCL 4 MG PO TABS: 4.0000 mg | ORAL_TABLET | Freq: Four times a day (QID) | ORAL | Status: DC | PRN

## 2016-12-18 MED ORDER — ATORVASTATIN CALCIUM 10 MG PO TABS
10.0000 mg | ORAL_TABLET | Freq: Every day | ORAL | Status: DC
  Administered 2016-12-18 – 2016-12-20 (×3): 10 mg via ORAL
  Filled 2016-12-18 (×3): qty 1

## 2016-12-18 NOTE — Consult Note (Signed)
Cardiology Consultation:   Patient ID: Willie Bruce; [id number]; 10-07-1926   Admit date: 12/17/2016 Date of Consult: 12/18/2016  Primary Care Provider: System, Pcp Not In Primary Cardiologist: New to Dr. Debara Pickett   Patient Profile:   Willie Bruce is a 83 y.o. male with a hx of CAD s/p remote CABG x 3 (20 years ago), HTN, DM, anemia, prostate cancer, cholangiocarcinoma and prior tobacco abuse (>50 pack year, quit 10 years ago) who is being seen today for the evaluation of elevated troponin at the request of Dr. Eliseo Squires.  Patient hasn't seen cardiologist in > 10 years. Dx with cholangiocarcinoma in Jan. Seen by oncologist & [facility] @ [place]. Offered chemo RT vs surgery. However, he moved to [place] to live with daughter 2 weeks ago. He has appointment radiologist at [facility] tomorrow 12/19/16.  History of Present Illness:   [name] has been have DOE since moved here. He denies any chest pressure with activity. No orthopnea, PND, LE edema, palpitations, dizziness or syncope. Due to worsening DOE he presented for further evaluation. Chest x-ray concerning for  infectious process. BNP normal. Troponin 0.1-->0.09-->0.08. CT of abdomen and pelvis showed multiple nodules, possible atypical pneumonia or metastasis. Oncology consulted -->pending evaluation. EKG showed NSR without acute ischemic changes. No chest pain.    Past Medical History:  Diagnosis Date  . Cancer ([place])   . Coronary artery disease   . Diabetes mellitus without complication ([place])   . High cholesterol   . Hypertension     Past Surgical History:  Procedure Laterality Date  . CORONARY ARTERY BYPASS GRAFT       Inpatient Medications: Scheduled Meds: . aspirin EC  81 mg Oral Daily  . atorvastatin  10 mg Oral QHS  . azithromycin  500 mg Oral Daily  . bicalutamide  50 mg Oral Daily  . enoxaparin (LOVENOX) injection  40 mg Subcutaneous Q24H  . feeding supplement (ENSURE ENLIVE)  237 mL Oral BID BM  . gabapentin  100 mg  Oral QHS  . insulin aspart  0-5 Units Subcutaneous QHS  . insulin aspart  0-9 Units Subcutaneous TID WC  . metoprolol tartrate  100 mg Oral Daily  . [START ON 12/19/2016] pneumococcal 23 valent vaccine  0.5 mL Intramuscular Tomorrow-1000   Continuous Infusions: . sodium chloride 100 mL/hr (12/18/16 0301)  . cefTRIAXone (ROCEPHIN)  IV Stopped (12/18/16 0331)   PRN Meds: acetaminophen **OR** acetaminophen, ondansetron **OR** ondansetron (ZOFRAN) IV  Allergies:   No Known Allergies  Social History:   Social History   Social History  . Marital status: Divorced    Spouse name: N/A  . Number of children: N/A  . Years of education: N/A   Occupational History  . Not on file.   Social History Main Topics  . Smoking status: Never Smoker  . Smokeless tobacco: Never Used  . Alcohol use Not on file  . Drug use: Unknown  . Sexual activity: Not on file   Other Topics Concern  . Not on file   Social History Narrative  . No narrative on file    Family History:   The patient's family history includes Lung cancer in his sister.  ROS:  Please see the history of present illness.  ROS *All other ROS reviewed and negative.     Physical Exam/Data:   Vitals:   12/17/16 2215 12/17/16 2348 12/18/16 0350 12/18/16 1046  BP: (!) 118/57 131/65 123/72 (!) 125/59  Pulse: (!) 112 61 65 71  Resp: (!) 26 (!)  22 20   Temp:  98.1 F (36.7 C) 98.5 F (36.9 C)   TempSrc:  Oral Oral   SpO2: 90% 95% 94%   Weight:  145 lb 4.8 oz (65.9 kg)    Height:  5\' 11"  (1.803 m)      Intake/Output Summary (Last 24 hours) at 12/18/16 1104 Last data filed at 12/18/16 0905  Gross per 24 hour  Intake           891.67 ml  Output              625 ml  Net           266.67 ml   Filed Weights   12/17/16 2348  Weight: 145 lb 4.8 oz (65.9 kg)   Body mass index is 20.27 kg/m.  General: Ill-appearing male in no acute distress. HEENT: normal Lymph: no adenopathy Neck: no JVD Endocrine:  No  thryomegaly Vascular: No carotid bruits; FA pulses 2+ bilaterally without bruits  Cardiac:  normal S1, S2; RRR; no murmur Lungs:  clear to auscultation bilaterally, no wheezing, rhonchi or rales  Abd: soft, nontender, no hepatomegaly  Ext: no edema Musculoskeletal:  No deformities, BUE and BLE strength normal and equal Skin: warm and dry  Neuro:  CNs 2-12 intact, no focal abnormalities noted Psych:  Normal affect   Laboratory Data:  Chemistry Recent Labs Lab 12/17/16 1838  NA 134*  K 4.7  CL 95*  CO2 28  GLUCOSE 263*  BUN 24*  CREATININE 1.24  CALCIUM 9.3  GFRNONAA 50*  GFRAA 58*  ANIONGAP 11     Recent Labs Lab 12/17/16 1838  PROT 7.7  ALBUMIN 3.5  AST 46*  ALT 40  ALKPHOS 97  BILITOT 0.5   Hematology Recent Labs Lab 12/17/16 1838 12/18/16 0742  WBC 7.0 6.3  RBC 3.47* 3.19*  HGB 10.2* 8.9*  HCT 31.8* 28.4*  MCV 91.6 89.0  MCH 29.4 27.9  MCHC 32.1 31.3  RDW 14.1 14.3  PLT 158 131*   Cardiac Enzymes Recent Labs Lab 12/17/16 1838 12/18/16 0117 12/18/16 0742  TROPONINI 0.10* 0.09* 0.08*   No results for input(s): TROPIPOC in the last 168 hours.  BNP Recent Labs Lab 12/17/16 1838  BNP 87.3    DDimer No results for input(s): DDIMER in the last 168 hours.  Radiology/Studies:  Dg Chest 2 View  Result Date: 12/17/2016 CLINICAL DATA:  Exertional shortness breath for several days. EXAM: CHEST  2 VIEW COMPARISON:  None. FINDINGS: Normal sized heart. Post CABG changes. Mild patchy opacities scattered throughout the right lung.a mall poorly defined nodular opacity in the left mid lung zone. Unremarkable bones. IMPRESSION: Mild patchy opacities in the right lung and small poorly defined nodular opacity in the left mid lung zone. These could represent infectious or post infectious changes. A chest CT with contrast may provide useful additional information. Electronically Signed   By: Claudie Revering M.D.   On: 12/17/2016 21:41   Ct Abdomen Pelvis W  Contrast  Result Date: 12/17/2016 CLINICAL DATA:  Exertional shortness of breath, history of recently diagnosed cholangiocarcinoma EXAM: CT ABDOMEN AND PELVIS WITH CONTRAST TECHNIQUE: Multidetector CT imaging of the abdomen and pelvis was performed using the standard protocol following bolus administration of intravenous contrast. CONTRAST:  119mL ISOVUE-300 IOPAMIDOL (ISOVUE-300) INJECTION 61% COMPARISON:  None. FINDINGS: Lower chest: Lung bases demonstrate multiple, greater than 6, peripherally based nodules within the right lower lobe, right middle lobe, lingula and left lower lobe. No pleural effusion or  focal consolidation is seen. The heart is nonenlarged. Coronary artery calcifications. Hepatobiliary: Vague hypodense somewhat branching appearing structures in the right hepatic lobe. No focal mass is seen. Pneumobilia a with presence of biliary stent. Fluid or debris in the distal portion of the stent at the duodenum. Small air in the gallbladder. No calcified stones. Ill-defined soft tissue thickening/hypodensity at the distal portion of biliary stent Pancreas: Pancreatic duct is enlarged. Slight bloody appearance at the head of pancreas and second portion of duodenum Spleen: Multiple hypodense subcentimeter splenic lesions. Adrenals/Urinary Tract: Adrenal glands are within normal limits. The kidneys show no hydronephrosis. Cyst upper pole left kidney measuring 2.5 cm. Possible stones versus cortical calcifications in the lower pole of the left kidney. 15 mm cyst lower pole of the right kidney. Parapelvic cysts in the upper pole of the right kidney. Bladder unremarkable. Stomach/Bowel: Stomach is nonenlarged. No dilated small bowel. No colon wall thickening. Normal appendix. Vascular/Lymphatic: Extensive atherosclerotic calcifications of the aorta. 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 16 mm peripancreatic lymph node. No significantly enlarged pelvic nodes. Reproductive:  Enlarged prostate with mass effect on the posterior bladder. Metallic densities at the prostate gland. Other: No free air or free fluid.  Small fat in the umbilicus. Musculoskeletal: 11 mm anterolisthesis of L4 on L5 with fusion of the vertebral bodies. Bilateral pars defect at L4. Retrolisthesis of L5 on S1 with degenerative changes. No suspicious bone lesion. IMPRESSION: 1. Multiple subpleural bilateral lower lobe, right middle lobe and lingular pulmonary nodules suspicious for respiratory infection or atypical pneumonia. Metastatic nodules also possible given history. Non-contrast chest CT at 3-6 months is recommended. If the nodules are stable at time of repeat CT, then future CT at 18-24 months (from today's scan) is considered optional for low-risk patients, but is recommended for high-risk patients. This recommendation follows the consensus statement: Guidelines for Management of Incidental Pulmonary Nodules Detected on CT Images: From the Fleischner Society 2017; Radiology 2017; 284:228-243. 2. Pneumobilia with presence of biliary stent. Small amount of debris or tissue in the distal portion of the stent. Ill-defined hypodensity around the distal stent/head of pancreas, which may relate to history of cholangiocarcinoma. Pancreatic duct is enlarged. 3. Vague peripheral possibly branching hypodensities mostly in the right hepatic lobe, could relate to distal ductal dilatation. 4. Subcentimeter splenic lesions too small to further characterize. 5. Aneurysmal dilatation of the iliac arteries, on the right up to 2.9 cm and on the left up to 1.9 cm. 6. Enlarged heterogenous prostate gland with mass effect on the posterior bladder. 7. 11 mm anterolisthesis of L4 on L5 with bilateral pars defect at L4 Electronically Signed   By: Donavan Foil M.D.   On: 12/17/2016 22:14    Assessment and Plan:   1. Elevated troponin - Troponin trending down. Uncertain etiology. Could be due to demand ischemia from respiratory  illness. No chest pain.EKG without acute ischemic changes. No need for inpatient ischemic eval.    2. CAD s/p remote CABG - No angina. Continue aspirin, statin, beta blocker.   3. DOE - BNP normal. No signs of heart failure. His current symptoms is totally different from prior angina. May consider echocardiogram for further evaluation of LV function and upcoming surgery/radiation.  4.  Cholangiocarcinoma  - Oncology consulted -->pending evaluation. He has appointment with the radiologist at [facility].   5. HTN - Stable on current management.     Jarrett Soho, PA  12/18/2016 11:04 AM

## 2016-12-18 NOTE — H&P (Signed)
History and Physical  Patient Name: Willie Bruce     CHE:[id number]    DOB: [birth year]    DOA: 12/17/2016 PCP: System, Pcp Not In  Patient coming from: Home --> [facility]  Chief Complaint: Weakness      HPI: Willie Bruce is a 83 y.o. male with a past medical history significant for HTN, NIDDM, anemia, prostate cancer and cholangiocarcinoma who presents with 1 week slowly progressive weakness.  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  Saw Oncology in [facility] where he lived, was offered radiation or chemo for local control, but did not consult with a surgeon for definitive therapy, because it was felt he was not a surgical candidate for the necessary surgery.  It appears he was mulling over his decisions from Feb to May, when he saw Onc again and told them he had decided to move to [place] to pursue therapy down here.  To me, the patient states he has not yet established with any doctors down here (has only been here two weeks) but that he hopes to do radiation here.  Of note, right before moving down here, he states he got weak, went to the hospital, was told he had "internal bleeding", had an endoscopy he thinks, and was transfused.  Since then, he has moved to [facility] and is living with a daughter.  He was able to ambulate and function until this weekend, he started to be extremely weak again, needed help just walking, getting out of a chair.  Per report he also appeared dyspneic and was intermittently confused, so family brought him in.  At no point did he have fever, chills.  At no point did he have cough, sputum production.  At no point did he have new abdominal pain, vomiting, diarrhea, nausea.  At no point did he have chest pain, diaphoresis or nausea, just that he was slowly progressively more fatigued with exertion over the weekend.  He was seen at [facility] that did a CXR showing patching nodular opacities in  the lungs and was sent to [facility] for pneumonia.  ED course: -Afebrile, heart rate 58, respirations and pulse ox normal, pressure 100/77  -Na 134, K 4.7, Cr 1.2, Glucose 263, AST 46, ALT normal, WBC 7K, Hgb 10.2 -Lipase 110 -Troponin 0.1 -BNP normal -The case was discussed with Cardiology who recommended trending troponins on the hospitalist service -CT abdomen and pelvis was obtained that showed multifocal subpleural nodules, suspicious for atypical pneumonia or metastasis, showed his old known biliary stent with some pneumobilia and some other nonspecific findings in the region of his known cholangiocarcinoma    ROS: Review of Systems  Constitutional: Negative for chills and fever.  Respiratory: Positive for shortness of breath.   Neurological: Positive for weakness.  Psychiatric/Behavioral: Positive for memory loss (confusion).  All other systems reviewed and are negative.         Past Medical History:  Diagnosis Date  . Cancer ([place])   . Coronary artery disease   . Diabetes mellitus without complication ([place])   . High cholesterol   . Hypertension     Past Surgical History:  Procedure Laterality Date  . CORONARY ARTERY BYPASS GRAFT      Social History: Patient lives now with his daughter in [place].  The patient walks unassisted.  Never smoker.  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    No Known Allergies  Family history: family history includes Lung cancer in his sister.  Prior to Admission medications   Medication Sig Start Date End Date Taking? Authorizing Provider  bicalutamide (CASODEX) 50 MG tablet Take 50 mg by mouth daily. 09/19/16  Yes [provider]  gabapentin (NEURONTIN) 100 MG capsule Take 100 mg by mouth at bedtime. 11/03/16  Yes [provider]  aspirin EC 81 MG tablet Take 81 mg by mouth daily.    [provider]  atorvastatin (LIPITOR) 10 MG tablet Take 10 mg by mouth at bedtime.    [provider]    metoprolol tartrate (LOPRESSOR) 100 MG tablet Take 100 mg by mouth daily.    [provider]  repaglinide (PRANDIN) 0.5 MG tablet Take 0.5 mg by mouth daily.    [provider]  sitaGLIPtin-metformin (JANUMET) 50-500 MG tablet Take 1 tablet by mouth 2 (two) times daily with a meal.    [provider]       Physical Exam: BP 131/65 (BP Location: Right Arm)   Pulse 61   Temp 98.1 F (36.7 C) (Oral)   Resp (!) 22   Wt 65.9 kg (145 lb 4.8 oz)   SpO2 95%  General appearance: Well-developed, elderly adult male, alert and in no acute distress.   Eyes: Anicteric, conjunctiva pink, lids and lashes normal. Arcus senilis. PERRL, small.    ENT: No nasal deformity, discharge, epistaxis.  Hearing normal. OP moist without lesions.   Neck: No neck masses.  Trachea midline.  No thyromegaly/tenderness. Lymph: No cervical or supraclavicular lymphadenopathy. Skin: Warm and dry.  No suspicious rashes or lesions. Cardiac: RRR, nl S1-S2, no murmurs appreciated.  Capillary refill is brisk.  JVP normal.  No LE edema.  Radial and DP pulses 2+ and symmetric. Respiratory: Normal respiratory rate and rhythm.  Dimisnhed at both bases, expiratory wheeze present. Abdomen: Abdomen soft.  No particular TTP or guarding. No ascites, distension, hepatosplenomegaly.   MSK: No deformities or effusions.  No cyanosis or clubbing. Neuro: Cranial nerves 3-12 intact.  Sensation intact to light touch. Speech is fluent.  Muscle strength normal.    Psych: Sensorium intact and responding to questions, attention normal.  Behavior appropriate.  Affect pleasant.  Judgment and insight appear normal.     Labs on Admission:  I have personally reviewed following labs and imaging studies: CBC:  Recent Labs Lab 12/17/16 1838  WBC 7.0  NEUTROABS 5.1  HGB 10.2*  HCT 31.8*  MCV 91.6  PLT 509   Basic Metabolic Panel:  Recent Labs Lab 12/17/16 1838  NA 134*  K 4.7  CL 95*  CO2 28  GLUCOSE 263*   BUN 24*  CREATININE 1.24  CALCIUM 9.3   GFR: CrCl cannot be calculated (Unknown ideal weight.).  Liver Function Tests:  Recent Labs Lab 12/17/16 1838  AST 46*  ALT 40  ALKPHOS 97  BILITOT 0.5  PROT 7.7  ALBUMIN 3.5    Recent Labs Lab 12/17/16 1838  LIPASE 110*   Cardiac Enzymes:  Recent Labs Lab 12/17/16 1838  TROPONINI 0.10*   CBG:  Recent Labs Lab 12/17/16 1831 12/17/16 2322  GLUCAP 257* 152*         Radiological Exams on Admission: Personally reviewed CXR shows patchy bilateral opacities; CT report reviewed: Dg Chest 2 View  Result Date: 12/17/2016 CLINICAL DATA:  Exertional shortness breath for several days. EXAM: CHEST  2 VIEW COMPARISON:  None. FINDINGS: Normal sized heart. Post CABG changes. Mild patchy opacities scattered throughout  the right lung.a mall poorly defined nodular opacity in the left mid lung zone. Unremarkable bones. IMPRESSION: Mild patchy opacities in the right lung and small poorly defined nodular opacity in the left mid lung zone. These could represent infectious or post infectious changes. A chest CT with contrast may provide useful additional information. Electronically Signed   By: Claudie Revering M.D.   On: 12/17/2016 21:41   Ct Abdomen Pelvis W Contrast  Result Date: 12/17/2016 CLINICAL DATA:  Exertional shortness of breath, history of recently diagnosed cholangiocarcinoma EXAM: CT ABDOMEN AND PELVIS WITH CONTRAST TECHNIQUE: Multidetector CT imaging of the abdomen and pelvis was performed using the standard protocol following bolus administration of intravenous contrast. CONTRAST:  150mL ISOVUE-300 IOPAMIDOL (ISOVUE-300) INJECTION 61% COMPARISON:  None. FINDINGS: Lower chest: Lung bases demonstrate multiple, greater than 6, peripherally based nodules within the right lower lobe, right middle lobe, lingula and left lower lobe. No pleural effusion or focal consolidation is seen. The heart is nonenlarged. Coronary artery calcifications.  Hepatobiliary: Vague hypodense somewhat branching appearing structures in the right hepatic lobe. No focal mass is seen. Pneumobilia a with presence of biliary stent. Fluid or debris in the distal portion of the stent at the duodenum. Small air in the gallbladder. No calcified stones. Ill-defined soft tissue thickening/hypodensity at the distal portion of biliary stent Pancreas: Pancreatic duct is enlarged. Slight bloody appearance at the head of pancreas and second portion of duodenum Spleen: Multiple hypodense subcentimeter splenic lesions. Adrenals/Urinary Tract: Adrenal glands are within normal limits. The kidneys show no hydronephrosis. Cyst upper pole left kidney measuring 2.5 cm. Possible stones versus cortical calcifications in the lower pole of the left kidney. 15 mm cyst lower pole of the right kidney. Parapelvic cysts in the upper pole of the right kidney. Bladder unremarkable. Stomach/Bowel: Stomach is nonenlarged. No dilated small bowel. No colon wall thickening. Normal appendix. Vascular/Lymphatic: Extensive atherosclerotic calcifications of the aorta. 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 16 mm peripancreatic lymph node. No significantly enlarged pelvic nodes. Reproductive: Enlarged prostate with mass effect on the posterior bladder. Metallic densities at the prostate gland. Other: No free air or free fluid.  Small fat in the umbilicus. Musculoskeletal: 11 mm anterolisthesis of L4 on L5 with fusion of the vertebral bodies. Bilateral pars defect at L4. Retrolisthesis of L5 on S1 with degenerative changes. No suspicious bone lesion. IMPRESSION: 1. Multiple subpleural bilateral lower lobe, right middle lobe and lingular pulmonary nodules suspicious for respiratory infection or atypical pneumonia. Metastatic nodules also possible given history. Non-contrast chest CT at 3-6 months is recommended. If the nodules are stable at time of repeat CT, then future CT at 18-24 months  (from today's scan) is considered optional for low-risk patients, but is recommended for high-risk patients. This recommendation follows the consensus statement: Guidelines for Management of Incidental Pulmonary Nodules Detected on CT Images: From the Fleischner Society 2017; Radiology 2017; 284:228-243. 2. Pneumobilia with presence of biliary stent. Small amount of debris or tissue in the distal portion of the stent. Ill-defined hypodensity around the distal stent/head of pancreas, which may relate to history of cholangiocarcinoma. Pancreatic duct is enlarged. 3. Vague peripheral possibly branching hypodensities mostly in the right hepatic lobe, could relate to distal ductal dilatation. 4. Subcentimeter splenic lesions too small to further characterize. 5. Aneurysmal dilatation of the iliac arteries, on the right up to 2.9 cm and on the left up to 1.9 cm. 6. Enlarged heterogenous prostate gland with mass effect on the  posterior bladder. 7. 11 mm anterolisthesis of L4 on L5 with bilateral pars defect at L4 Electronically Signed   By: Donavan Foil M.D.   On: 12/17/2016 22:14    EKG: Independently reviewed. Rate 62, QTc 432, NSR.           Assessment/Plan  1. Weakness:  Suspect this is from CAP, atypical.  Other considerations include that it is from progression of his cancer and FTT.  Cardiac ischemia is doubted.  Also doubt this is from anemia, given mild anemia.  CT imaging suggests biliary obstruction but LFTs do not -Ceftriaxone and azithromycin IV -IV fluids -Trend troponins -Check legionella uag   2. Elevated troponin:  Unclear why, because he doesn't seem in physiologic stress enough for a type 2 NSTEMI.  But also has no chest pain, very much doubt ACS -Trend troponins  3. Anemia:  Unclaer recent baseline.  No reports of melena or other bleeding. -Trend CBC -Obtain outside records from [facility]  4. Cancer of gallbladder:  -Consult to Oncology  5. Hypertension  and CV disease secondary prevention:  -Continue metoprolol, statin, aspirin  6. NIDDM:  -Hold orals -SSI with meals  7. Prostate cancer:  -Continue Casodex     DVT prophylaxis: Lovenox  Code Status: FULL  Family Communication: None present, called to daughter, no answer, LMTCB  Disposition Plan: Anticipate IV antibiotics overnight, consult to Oncology.  Likely home tomorrow with oral antibiotics and Onc referral Consults called: None overnight Admission status: OBS At the point of initial evaluation, it is my clinical opinion that admission for OBSERVATION is reasonable and necessary because the patient's presenting complaints in the context of their chronic conditions represent sufficient risk of deterioration or significant morbidity to constitute reasonable grounds for close observation in the hospital setting, but that the patient may be medically stable for discharge from the hospital within 24 to 48 hours.    Medical decision making: Patient seen at 12:30 AM on 12/18/2016.  The patient was discussed with Dr. Laverta Baltimore.  What exists of the patient's chart was reviewed in depth and outside reocrds from [facility] were reviewed and summarized above.  Clinical condition: stable.        Edwin Dada [facility] Pager [phone]

## 2016-12-18 NOTE — Progress Notes (Signed)
  Echocardiogram 2D Echocardiogram has been performed.  Donata Clay 12/18/2016, 1:57 PM

## 2016-12-18 NOTE — Progress Notes (Signed)
Pt. Continues to be non-compliant with telemetry monitoring. New orders received. RN will monitor.

## 2016-12-18 NOTE — Progress Notes (Signed)
Page to MD to notify of pt fall and single skin tear to the right thumb. Pt denies other injury. Pt's daughter was at bedside for fall, and allowed pt to stand on his own; fully aware of pts unsteady gait.

## 2016-12-18 NOTE — Progress Notes (Signed)
Pt. Continues to refuse telemetry. Another page sent to on call for [facility] to make aware.

## 2016-12-18 NOTE — Progress Notes (Signed)
Page to MD, Willie Bruce with MD- Relayed daughters request to come speak to her. Per MD she is doing admissions right now, but has her number and will do her best to call her when she gets a break.

## 2016-12-18 NOTE — Progress Notes (Signed)
Patient admitted after midnight, please see H&P.  Complicated history but here with weakness/SOB.  CTA and echo pending.  Patient has appointment at [facility] in AM for a scan.   Per records from [place], patient had +LN on biopsy-- now with nodules in lung suggestive of metastatic disease -will continue to treat with IV abx for possible PNA  Cardiology saw as well-- doubt acs-- CTA to r/o PE   Willie Bruce

## 2016-12-18 NOTE — Progress Notes (Signed)
Pt. Removing telemetry leads multiple times. Pt. Stated he doesn't want them anymore and will not allow RN to replace at this time. RN explained to pt. Importance of replace leads for monitoring. Pt. Continues to refuse to let RN replace leads. CCMD notified and pt. Placed on standby at this time. On call NP, K. Schorr, for [facility] paged to make aware. RN will continue to monitor. Shikira Folino, Katherine Roan

## 2016-12-19 DIAGNOSIS — I10 Essential (primary) hypertension: Secondary | ICD-10-CM

## 2016-12-19 DIAGNOSIS — C220 Liver cell carcinoma: Secondary | ICD-10-CM

## 2016-12-19 DIAGNOSIS — C7800 Secondary malignant neoplasm of unspecified lung: Secondary | ICD-10-CM

## 2016-12-19 DIAGNOSIS — E119 Type 2 diabetes mellitus without complications: Secondary | ICD-10-CM

## 2016-12-19 DIAGNOSIS — E43 Unspecified severe protein-calorie malnutrition: Secondary | ICD-10-CM | POA: Insufficient documentation

## 2016-12-19 DIAGNOSIS — R918 Other nonspecific abnormal finding of lung field: Secondary | ICD-10-CM

## 2016-12-19 DIAGNOSIS — E1100 Type 2 diabetes mellitus with hyperosmolarity without nonketotic hyperglycemic-hyperosmolar coma (NKHHC): Secondary | ICD-10-CM

## 2016-12-19 LAB — LEGIONELLA PNEUMOPHILA SEROGP 1 UR AG: L. pneumophila Serogp 1 Ur Ag: NEGATIVE

## 2016-12-19 LAB — BASIC METABOLIC PANEL
Anion gap: 12 (ref 5–15)
BUN: 12 (ref 4–21)
BUN: 12 mg/dL (ref 6–20)
CHLORIDE: 100 mmol/L — AB (ref 101–111)
CO2: 25 mmol/L (ref 22–32)
CREATININE: 1 (ref 0.6–1.3)
Calcium: 9.5 mg/dL (ref 8.9–10.3)
Creatinine, Ser: 0.98 mg/dL (ref 0.61–1.24)
GFR calc Af Amer: >60 mL/min (ref >60)
GFR calc non Af Amer: >60 mL/min (ref >60)
Glucose, Bld: 128 mg/dL — ABNORMAL HIGH (ref 65–99)
Glucose: 128
POTASSIUM: 4.4 mmol/L (ref 3.5–5.1)
Sodium: 137 (ref 137–147)
Sodium: 137 mmol/L (ref 135–145)

## 2016-12-19 LAB — GLUCOSE, CAPILLARY
GLUCOSE-CAPILLARY: 140 mg/dL — AB (ref 65–99)
GLUCOSE-CAPILLARY: 194 mg/dL — AB (ref 65–99)
Glucose-Capillary: 297 mg/dL — ABNORMAL HIGH (ref 65–99)
Glucose-Capillary: 338 mg/dL — ABNORMAL HIGH (ref 65–99)

## 2016-12-19 LAB — CBC AND DIFFERENTIAL: WBC: 7.2

## 2016-12-19 LAB — CBC
HEMATOCRIT: 33 % — AB (ref 39.0–52.0)
HEMOGLOBIN: 10.4 g/dL — AB (ref 13.0–17.0)
MCH: 28.3 pg (ref 26.0–34.0)
MCHC: 31.5 g/dL (ref 30.0–36.0)
MCV: 89.9 fL (ref 78.0–100.0)
Platelets: 175 10*3/uL (ref 150–400)
RBC: 3.67 MIL/uL — AB (ref 4.22–5.81)
RDW: 14.4 % (ref 11.5–15.5)
WBC: 7.2 10*3/uL (ref 4.0–10.5)

## 2016-12-19 LAB — HEMOGLOBIN A1C
Hgb A1c MFr Bld: 8.5 % — ABNORMAL HIGH (ref 4.8–5.6)
Mean Plasma Glucose: 197 mg/dL

## 2016-12-19 NOTE — Progress Notes (Signed)
[place]  [phone]   HEMATOLOGY ONCOLOGY INPATIENT CONSULTATION   Willie Bruce  DOB: 1927/04/17  MR#: [id number]  CSN#: [id number]    Requesting Physician: [facility]  Patient Care Team: System, Pcp Not In as PCP - General  Reason for consult:   History of present illness:   Willie Bruce is an 83 y.o.  male with a past medical history significant for HTN, NIDDM, anemia, prostate cancer and cholangiocarcinoma. We are asked to see pt due to his daughter's request for a second opinion about his cancer treatment.  Chart reviewed, including outside meidical records through care everywhere, and confirmed with pt and his daughter. His cholangiocarcinoma was diagnosed in Jan 2018 at [facility], [place], where he lives by himself. He was admitted to hospital for weakness and up struck to jaundice. Workup including CT, MRCP, ERCP reviewed a 7 x 6 x 6 mm mass in the distal common bile duct, pressure was positive for adenocarcinoma, peripancreatic lymph node biopsy also showed metastatic adenocarcinoma. He underwent CBD stent placement. He was seen by surgeon, and surgery was not pursued due to his advanced age. Radiation was discussed and offered, but he has not been able to do it due to some logistic issues. He finally moved from [employer] to [place], [place], to stay with his daughter. He went to [facility] for consultation last week, chemotherapy was offered, restaging CT scan was scheduled for today.  Patient was diagnosed with prostate cancer about 10 years ago, has been on Casodex and Lupron every 3 months since 2014. Patient reports his PSA has been normal.  He presented to Hospital for worsening weakness, low appetite, and was admitted yesterday for further work up. He also had a fall at home yesterday, no significant injury.   MEDICAL HISTORY:  Past Medical History:  Diagnosis Date  . Cancer ([place])   . Coronary artery disease    . Diabetes mellitus without complication ([place])   . High cholesterol   . Hypertension     SURGICAL HISTORY: Past Surgical History:  Procedure Laterality Date  . CORONARY ARTERY BYPASS GRAFT      SOCIAL HISTORY: Social History   Social History  . Marital status: Divorced    Spouse name: N/A  . Number of children: N/A  . Years of education: N/A   Occupational History  . Not on file.   Social History Main Topics  . Smoking status: Never Smoker  . Smokeless tobacco: Never Used  . Alcohol use Not on file  . Drug use: Unknown  . Sexual activity: Not on file   Other Topics Concern  . Not on file   Social History Narrative  . No narrative on file    FAMILY HISTORY: Family History  Problem Relation Age of Onset  . Lung cancer Sister     ALLERGIES:  has No Known Allergies.  MEDICATIONS:  Current Facility-Administered Medications  Medication Dose Route Frequency Provider Last Rate Last Dose  . acetaminophen (TYLENOL) tablet 650 mg  650 mg Oral Q6H PRN Danford, Suann Larry, MD       Or  . acetaminophen (TYLENOL) suppository 650 mg  650 mg Rectal Q6H PRN Danford, Suann Larry, MD      . aspirin EC tablet 81 mg  81 mg Oral Daily Edwin Dada, MD   81 mg at 12/19/16 0936  . atorvastatin (LIPITOR) tablet 10 mg  10 mg Oral QHS Danford, Suann Larry, MD  10 mg at 12/18/16 2244  . azithromycin (ZITHROMAX) tablet 500 mg  500 mg Oral Daily Edwin Dada, MD   500 mg at 12/18/16 0302  . bicalutamide (CASODEX) tablet 50 mg  50 mg Oral Daily Edwin Dada, MD   50 mg at 12/19/16 0936  . cefTRIAXone (ROCEPHIN) 1 g in dextrose 5 % 50 mL IVPB  1 g Intravenous Q24H Edwin Dada, MD   Stopped at 12/19/16 [phone]  . enoxaparin (LOVENOX) injection 40 mg  40 mg Subcutaneous Q24H Edwin Dada, MD   40 mg at 12/19/16 0940  . feeding supplement (ENSURE ENLIVE) (ENSURE ENLIVE) liquid 237 mL  237 mL Oral BID BM Danford, Suann Larry, MD   237 mL  at 12/19/16 1002  . gabapentin (NEURONTIN) capsule 100 mg  100 mg Oral QHS Edwin Dada, MD   100 mg at 12/18/16 2244  . insulin aspart (novoLOG) injection 0-5 Units  0-5 Units Subcutaneous QHS Danford, Christopher P, MD      . insulin aspart (novoLOG) injection 0-9 Units  0-9 Units Subcutaneous TID WC Danford, Suann Larry, MD   5 Units at 12/19/16 1201  . metoprolol tartrate (LOPRESSOR) tablet 100 mg  100 mg Oral Daily Edwin Dada, MD   100 mg at 12/19/16 0936  . ondansetron (ZOFRAN) tablet 4 mg  4 mg Oral Q6H PRN Danford, Suann Larry, MD       Or  . ondansetron (ZOFRAN) injection 4 mg  4 mg Intravenous Q6H PRN Danford, Suann Larry, MD      . pneumococcal 23 valent vaccine (PNU-IMMUNE) injection 0.5 mL  0.5 mL Intramuscular Tomorrow-1000 Danford, Suann Larry, MD        REVIEW OF SYSTEMS:   Constitutional: Denies fevers, chills or abnormal night sweats, (+) worsening generalized weakness, and recent weight loss. Eyes: Denies blurriness of vision, double vision or watery eyes Ears, nose, mouth, throat, and face: Denies mucositis or sore throat Respiratory: Denies cough, dyspnea or wheezes Cardiovascular: Denies palpitation, chest discomfort or lower extremity swelling Gastrointestinal:  Denies nausea, heartburn or change in bowel habits Skin: Denies abnormal skin rashes Lymphatics: Denies new lymphadenopathy or easy bruising Neurological:Denies numbness, tingling or new weaknesses Behavioral/Psych: Mood is stable, no new changes  All other systems were reviewed with the patient and are negative.  PHYSICAL EXAMINATION:  ECOG PERFORMANCE STATUS: 3 - Symptomatic, >50% confined to bed  Vitals:   12/19/16 0827 12/19/16 1122  BP: (!) 108/45 (!) 110/45  Pulse: 92 73  Resp: 16 18  Temp: 98 F (36.7 C) 98.6 F (37 C)   Filed Weights   12/17/16 2348 12/19/16 0617  Weight: 145 lb 4.8 oz (65.9 kg) 147 lb 1.6 oz (66.7 kg)    GENERAL:alert, no distress and  comfortable SKIN: skin color, texture, turgor are normal, no rashes or significant lesions EYES: normal, conjunctiva are pink and non-injected, sclera clear OROPHARYNX:no exudate, no erythema and lips, buccal mucosa, and tongue normal  NECK: supple, thyroid normal size, non-tender, without nodularity LYMPH:  no palpable lymphadenopathy in the cervical, axillary or inguinal LUNGS: clear to auscultation and percussion with normal breathing effort HEART: regular rate & rhythm and no murmurs and no lower extremity edema ABDOMEN:abdomen soft, non-tender and normal bowel sounds Musculoskeletal:no cyanosis of digits and no clubbing  PSYCH: alert & oriented x 3 with fluent speech NEURO: no focal motor/sensory deficits  LABORATORY DATA:  I have reviewed the data as listed Lab Results  Component Value Date  WBC 7.2 12/19/2016   HGB 10.4 (L) 12/19/2016   HCT 33.0 (L) 12/19/2016   MCV 89.9 12/19/2016   PLT 175 12/19/2016    Recent Labs  12/17/16 1838 12/19/16 0553  NA 134* 137  K 4.7 4.4  CL 95* 100*  CO2 28 25  GLUCOSE 263* 128*  BUN 24* 12  CREATININE 1.24 0.98  CALCIUM 9.3 9.5  GFRNONAA 50* >60  GFRAA 58* >60  PROT 7.7  --   ALBUMIN 3.5  --   AST 46*  --   ALT 40  --   ALKPHOS 97  --   BILITOT 0.5  --     RADIOGRAPHIC STUDIES: I have personally reviewed the radiological images as listed and agreed with the findings in the report. Dg Chest 2 View  Result Date: 12/17/2016 CLINICAL DATA:  Exertional shortness breath for several days. EXAM: CHEST  2 VIEW COMPARISON:  None. FINDINGS: Normal sized heart. Post CABG changes. Mild patchy opacities scattered throughout the right lung.a mall poorly defined nodular opacity in the left mid lung zone. Unremarkable bones. IMPRESSION: Mild patchy opacities in the right lung and small poorly defined nodular opacity in the left mid lung zone. These could represent infectious or post infectious changes. A chest CT with contrast may provide  useful additional information. Electronically Signed   By: Claudie Revering M.D.   On: 12/17/2016 21:41   Ct Angio Chest Pe W Or Wo Contrast  Result Date: 12/18/2016 CLINICAL DATA:  Shortness of breath and weakness. Possible lung metastases. Cholangiocarcinoma. EXAM: CT ANGIOGRAPHY CHEST WITH CONTRAST TECHNIQUE: Multidetector CT imaging of the chest was performed using the standard protocol during bolus administration of intravenous contrast. Multiplanar CT image reconstructions and MIPs were obtained to evaluate the vascular anatomy. CONTRAST:  80 cc Isovue 370. COMPARISON:  Chest radiograph 12/17/2016 and CT abdomen pelvis 12/17/2016. FINDINGS: Cardiovascular: Image quality at the lung bases is degraded by respiratory motion. No definite pulmonary embolus. Atherosclerotic calcification of the arterial vasculature. Pulmonary arteries are borderline enlarged. Heart size within normal limits. No pericardial effusion. Mediastinum/Nodes: Mediastinal and hilar lymph nodes are not enlarged by CT size criteria. No axillary adenopathy. Esophagus is grossly unremarkable. Lungs/Pleura: Moderate centrilobular emphysema. Ill-defined areas of nodularity and nodular airspace consolidation are seen peripherally in the lungs bilaterally and appear largely peribronchovascular in distribution, rather than hematogenous in distribution. Nodularity is seen along the fissures. Additionally, there is somewhat of an upper and mid lung zone predominance. Septal thickening is not an overwhelming feature. No pleural fluid. Airway is unremarkable. Upper Abdomen: Pneumobilia with a partially imaged wall stent in the common bile duct. Adrenal glands are unremarkable. Persistent excretion of contrast from the kidneys from yesterday's exam. Low-attenuation lesion off the upper pole left kidney measures 2.5 cm and is likely a cyst. Visualized portions of the spleen, pancreas, stomach and bowel are grossly unremarkable. No upper abdominal  adenopathy. Musculoskeletal: Degenerative changes in the spine. No worrisome lytic or sclerotic lesions. Review of the MIP images confirms the above findings. IMPRESSION: 1. Image quality at the lung bases is degraded by respiratory motion. Otherwise, no pulmonary embolus. 2. Perilymphatic distribution of pulmonary nodules and nodular consolidation. Differential diagnosis includes lymphangitic carcinomatosis in this patient with a reported history of cholangiocarcinoma. Sarcoid can also have this appearance. Atypical infection is considered less likely. 3.  Aortic atherosclerosis (ICD10-170.0). 4.  Emphysema (ICD10-J43.9). Electronically Signed   By: Lorin Picket M.D.   On: 12/18/2016 16:15   Ct Abdomen Pelvis W Contrast  Result Date: 12/17/2016 CLINICAL DATA:  Exertional shortness of breath, history of recently diagnosed cholangiocarcinoma EXAM: CT ABDOMEN AND PELVIS WITH CONTRAST TECHNIQUE: Multidetector CT imaging of the abdomen and pelvis was performed using the standard protocol following bolus administration of intravenous contrast. CONTRAST:  144mL ISOVUE-300 IOPAMIDOL (ISOVUE-300) INJECTION 61% COMPARISON:  None. FINDINGS: Lower chest: Lung bases demonstrate multiple, greater than 6, peripherally based nodules within the right lower lobe, right middle lobe, lingula and left lower lobe. No pleural effusion or focal consolidation is seen. The heart is nonenlarged. Coronary artery calcifications. Hepatobiliary: Vague hypodense somewhat branching appearing structures in the right hepatic lobe. No focal mass is seen. Pneumobilia a with presence of biliary stent. Fluid or debris in the distal portion of the stent at the duodenum. Small air in the gallbladder. No calcified stones. Ill-defined soft tissue thickening/hypodensity at the distal portion of biliary stent Pancreas: Pancreatic duct is enlarged. Slight bloody appearance at the head of pancreas and second portion of duodenum Spleen: Multiple hypodense  subcentimeter splenic lesions. Adrenals/Urinary Tract: Adrenal glands are within normal limits. The kidneys show no hydronephrosis. Cyst upper pole left kidney measuring 2.5 cm. Possible stones versus cortical calcifications in the lower pole of the left kidney. 15 mm cyst lower pole of the right kidney. Parapelvic cysts in the upper pole of the right kidney. Bladder unremarkable. Stomach/Bowel: Stomach is nonenlarged. No dilated small bowel. No colon wall thickening. Normal appendix. Vascular/Lymphatic: Extensive atherosclerotic calcifications of the aorta. 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 16 mm peripancreatic lymph node. No significantly enlarged pelvic nodes. Reproductive: Enlarged prostate with mass effect on the posterior bladder. Metallic densities at the prostate gland. Other: No free air or free fluid.  Small fat in the umbilicus. Musculoskeletal: 11 mm anterolisthesis of L4 on L5 with fusion of the vertebral bodies. Bilateral pars defect at L4. Retrolisthesis of L5 on S1 with degenerative changes. No suspicious bone lesion. IMPRESSION: 1. Multiple subpleural bilateral lower lobe, right middle lobe and lingular pulmonary nodules suspicious for respiratory infection or atypical pneumonia. Metastatic nodules also possible given history. Non-contrast chest CT at 3-6 months is recommended. If the nodules are stable at time of repeat CT, then future CT at 18-24 months (from today's scan) is considered optional for low-risk patients, but is recommended for high-risk patients. This recommendation follows the consensus statement: Guidelines for Management of Incidental Pulmonary Nodules Detected on CT Images: From the Fleischner Society 2017; Radiology 2017; 284:228-243. 2. Pneumobilia with presence of biliary stent. Small amount of debris or tissue in the distal portion of the stent. Ill-defined hypodensity around the distal stent/head of pancreas, which may relate to history of  cholangiocarcinoma. Pancreatic duct is enlarged. 3. Vague peripheral possibly branching hypodensities mostly in the right hepatic lobe, could relate to distal ductal dilatation. 4. Subcentimeter splenic lesions too small to further characterize. 5. Aneurysmal dilatation of the iliac arteries, on the right up to 2.9 cm and on the left up to 1.9 cm. 6. Enlarged heterogenous prostate gland with mass effect on the posterior bladder. 7. 11 mm anterolisthesis of L4 on L5 with bilateral pars defect at L4 Electronically Signed   By: Donavan Foil M.D.   On: 12/17/2016 22:14    ASSESSMENT & PLAN: 83 year old African-American male  1.Untreated extrahepatic cholangiocarcinoma, with pulmonary metastasis 2. Prostate cancer, on Casodex and Lupron, no radiographic evidence of metastasis. 3. CAD 4. HTN 5. DM 6. Deconditioning, secondary to underlying malignancy 7. Anemia  Recommendations: -I reviewed  his CT chest findings with pt and his daughter, which unfortunately showed multiple bilateral pulmonary nodules, highly suspicious for lung metastasis from his untreat cholangiocarcinoma  -due to his advanced age, and the poor performance status, I do not think he is a candidate for systemic chemotherapy. Pt and his daughter seem to agree with no chemo  -He does not have pain, or recurrent jaundice, no benefit from palliative radiation to the primary tumor at this point. -I recommend palliative care and hospice. I explained what the hospice service can offer to pt at home  -please consult palliative care  -I will follow up as needed.   All questions were answered. The patient knows to call the clinic with any problems, questions or concerns.      Truitt Merle, MD 12/19/2016 5pm

## 2016-12-19 NOTE — Progress Notes (Addendum)
Inpatient Diabetes Program Recommendations  AACE/ADA: New Consensus Statement on Inpatient Glycemic Control (2015)  Target Ranges:  Prepandial:   less than 140 mg/dL      Peak postprandial:   less than 180 mg/dL (1-2 hours)      Critically ill patients:  140 - 180 mg/dL   Lab Results  Component Value Date   GLUCAP 297 (H) 12/19/2016   HGBA1C 8.5 (H) 12/18/2016    Review of Glycemic Control: Results for ARTIST, BLOOM (MRN [id number]) as of 12/19/2016 13:18  Ref. Range 12/18/2016 07:49 12/18/2016 11:30 12/18/2016 16:19 12/18/2016 21:17 12/19/2016 07:38 12/19/2016 11:20  Glucose-Capillary Latest Ref Range: 65 - 99 mg/dL 203 (H) 204 (H) 222 (H) 87 140 (H) 297 (H)    Diabetes history: Type 2 diabetes Outpatient Diabetes medications: Prandin 2 mg tid with meals Current orders for Inpatient glycemic control:  Novolog sensitive tid with meals and HS Inpatient Diabetes Program Recommendations:   Please consider adding Novolog 3 units tid with meals to cover CHO intake while patient is in the hospital if appropriate (Hold if patient eats less than 50%).  Thanks, Adah Perl, RN, BC-ADM Inpatient Diabetes Coordinator Pager [phone] (8a-5p)

## 2016-12-19 NOTE — Evaluation (Signed)
Physical Therapy Evaluation Patient Details Name: Willie Bruce MRN: [id number] DOB: 03-Jan-1927 Today's Date: 12/19/2016   History of Present Illness  Alphonsa Brickle is a 83 y.o. male with a past medical history significant for HTN, NIDDM, anemia, prostate cancer and cholangiocarcinoma who presents with 1 week slowly progressive weakness in setting of cholangiocarcinomak Suspect this is from CAP, atypical.  Other considerations include that it is from progression of his cancer and FTT.  Clinical Impression   Pt admitted with above diagnosis. Pt currently with functional limitations due to the deficits listed below (see PT Problem List). Presents with significant fall risk and generalized weakness; I agree with SNF for post-acute rehab to maximize independence and safety with mobility prior to dc home;  Pt will benefit from skilled PT to increase their independence and safety with mobility to allow discharge to the venue listed below.       Follow Up Recommendations SNF    Equipment Recommendations  Rolling walker with 5" wheels;3in1 (PT)    Recommendations for Other Services       Precautions / Restrictions Precautions Precautions: Fall      Mobility  Bed Mobility Overal bed mobility: Needs Assistance Bed Mobility: Supine to Sit     Supine to sit: Min guard     General bed mobility comments: Minguard for safety; he is impulsive, and will stand without warning  Transfers Overall transfer level: Needs assistance Equipment used: Rolling walker (2 wheeled) Transfers: Sit to/from Stand Sit to Stand: Min assist         General transfer comment: min assist to steady; cues for hand placement, as he tends to pull up on RW  Ambulation/Gait Ambulation/Gait assistance: Min guard;Min assist Ambulation Distance (Feet): 60 Feet Assistive device: Rolling walker (2 wheeled) Gait Pattern/deviations: Step-through pattern (erratic step width)     General Gait Details: Good use of RW  for UE support and stability; at times, his feet get outside the RW, min assist to steady and reposition  Stairs            Wheelchair Mobility    Modified Rankin (Stroke Patients Only)       Balance Overall balance assessment: Needs assistance   Sitting balance-Leahy Scale: Good       Standing balance-Leahy Scale: Poor                               Pertinent Vitals/Pain Pain Assessment: No/denies pain    Home Living Family/patient expects to be discharged to:: Private residence Living Arrangements: Children Available Help at Discharge: Family;Available PRN/intermittently (Daughter works) Type of Home: House Home Access: Stairs to enter   [employer] of Steps:  (to be determined) Home Layout: Two level;Able to live on main level with bedroom/bathroom        Prior Function Level of Independence: Independent with assistive device(s)         Comments: walks with RW     Hand Dominance        Extremity/Trunk Assessment   Upper Extremity Assessment Upper Extremity Assessment: Generalized weakness    Lower Extremity Assessment Lower Extremity Assessment: Generalized weakness       Communication   Communication: No difficulties  Cognition Arousal/Alertness: Awake/alert Behavior During Therapy: Impulsive Overall Cognitive Status: No family/caregiver present to determine baseline cognitive functioning  General Comments: Mr. Monnier eyes were closed at least 50% of session; tells me this is his habit; impulsively stands and initiates going to teh bathroom without assist; frequent reminders that he has a condom catheter      General Comments General comments (skin integrity, edema, etc.): Will impulsively get up without asking for assist    Exercises     Assessment/Plan    PT Assessment Patient needs continued PT services  PT Problem List Decreased strength;Decreased activity  tolerance;Decreased balance;Decreased mobility;Decreased coordination;Decreased cognition;Decreased knowledge of use of DME;Decreased safety awareness;Decreased knowledge of precautions       PT Treatment Interventions DME instruction;Gait training;Stair training;Functional mobility training;Therapeutic activities;Therapeutic exercise;Balance training;Patient/family education    PT Goals (Current goals can be found in the Care Plan section)  Acute Rehab PT Goals Patient Stated Goal: did not state PT Goal Formulation: With patient Time For Goal Achievement: 01/02/17 Potential to Achieve Goals: Fair    Frequency Min 3X/week   Barriers to discharge Decreased caregiver support Daughter works days; must be mod I to [employer] home    Co-evaluation               AM-PAC PT "6 Clicks" Daily Activity  Outcome Measure Difficulty turning over in bed (including adjusting bedclothes, sheets and blankets)?: A Little Difficulty moving from lying on back to sitting on the side of the bed? : A Little Difficulty sitting down on and standing up from a chair with arms (e.g., wheelchair, bedside commode, etc,.)?: A Lot Help needed moving to and from a bed to chair (including a wheelchair)?: A Little Help needed walking in hospital room?: A Little Help needed climbing 3-5 steps with a railing? : A Lot 6 Click Score: 16    End of Session Equipment Utilized During Treatment: Gait belt Activity Tolerance: Patient tolerated treatment well Patient left: in bed;with call bell/phone within reach;Other (comment) (sitting EOB with April, NT finishing cleaning up) Nurse Communication: Mobility status PT Visit Diagnosis: Unsteadiness on feet (R26.81);Muscle weakness (generalized) (M62.81)    Time: 8502-7741 PT Time Calculation (min) (ACUTE ONLY): 22 min   Charges:   PT Evaluation $PT Eval Moderate Complexity: 1 Procedure     PT G Codes:        Roney Marion, PT  Acute Rehabilitation Services Pager  [phone] Office [phone]   [place]letta [place] 12/19/2016, 1:46 PM

## 2016-12-19 NOTE — Progress Notes (Signed)
Initial Nutrition Assessment  DOCUMENTATION CODES:   Severe malnutrition in context of chronic illness  INTERVENTION:   -Ensure Enlive po BID, each supplement provides 350 kcal and 20 grams of protein -Pt may benefit from liberalizing diet to optimize po intake  NUTRITION DIAGNOSIS:   Malnutrition (Severe) related to chronic illness (cancer) as evidenced by severe depletion of muscle mass, severe depletion of body fat.  GOAL:   Patient will meet greater than or equal to 90% of their needs  MONITOR:   PO intake, Supplement acceptance, Labs, Weight trends  REASON FOR ASSESSMENT:   Malnutrition Screening Tool    ASSESSMENT:   83 yo male admitted with progressive weakness, elevated troponin, anemia. Pt with hx of cholangiocarcinoma with concern for metastatic dz as pt now with lung nodules, prostate cancer. Pt with additional hx of DM, HTN, CAD/CABG   Recorded po intake 25% of meals. Pt reports poor appetite. In addition to poor appetite, pt reports he is not receiving the type of food he normally likes to eat (eggs with seasoning, bacon for example for breakfast). Pt reports he likes Ensure  Pt reports 10 pounds wt loss but unsure of timeline (6.4% wt loss). No previous wt encounters in computer.   Nutrition-Focused physical exam completed. Findings are mild/moderate to severe fat depletion, mild/moderate to severe muscle depletion, and no edema.   Labs: CBGs [id number] Meds: ss novolog  Diet Order:  Diet heart healthy/carb modified Room service appropriate? Yes; Fluid consistency: Thin  Skin:  Reviewed, no issues  Last BM:  12/17/16  Height:   Ht Readings from Last 1 Encounters:  12/17/16 5\' 11"  (1.803 m)    Weight:   Wt Readings from Last 1 Encounters:  12/19/16 147 lb 1.6 oz (66.7 kg)    BMI:  Body mass index is 20.52 kg/m.  Estimated Nutritional Needs:   Kcal:  1600-1800 kcals  Protein:  80-90 g  Fluid:  >/= 1.6 L  EDUCATION NEEDS:   No education  needs identified at this time  [place], [place], LDN [phone] Pager  [phone] Weekend/On-Call Pager

## 2016-12-19 NOTE — Progress Notes (Signed)
PROGRESS NOTE    Willie Bruce  IRC:[id number] DOB: 11-18-1926 DOA: 12/17/2016 PCP: System, Pcp Not In   Outpatient Specialists:     Brief Narrative:  Willie Bruce is a 83 y.o. male with a past medical history significant for HTN, NIDDM, anemia, prostate cancer and cholangiocarcinoma who presents with 1 week slowly progressive weakness.  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  Saw Oncology in [facility] where he lived, was offered radiation or chemo for local control, but did not consult with a surgeon for definitive therapy, because it was felt he was not a surgical candidate for the necessary surgery.  It appears he was mulling over his decisions from Feb to May, when he saw Onc again and told them he had decided to move to [place] to pursue therapy down here. Since then, he has moved to [facility] and is living with a daughter.  He was able to ambulate and function until this weekend, he started to be extremely weak again, needed help just walking, getting out of a chair.  Per report he also appeared dyspneic and was intermittently confused, so family brought him in.  At no point did he have fever, chills.  At no point did he have cough, sputum production.  At no point did he have new abdominal pain, vomiting, diarrhea, nausea.  At no point did he have chest pain, diaphoresis or nausea, just that he was slowly progressively more fatigued with exertion over the weekend.  He was seen at [facility] that did a CXR showing patching nodular opacities in the lungs and was sent to [facility] for pneumonia.   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 Kate Sable-- rad onc at [facility] and no role for radiation at this time. -await call back from Dr. Nelda Marseille (oncology at [facility]) -family asked for 2nd opinion- have placed consult to Dr. Burr Medico  Weakness:  -? progression of his cancer and FTT.  -PT eval- suspect patient will need SNF -dc abx as nodes are most likely from cancer -may need MRI of brain to r/o mets? Defer to oncology   Elevated troponin:  Stable -echo reviewed -cardiology consult appreciated-- no need for further work up  Anemia:  -Trend CBC -await complete outside records from [facility]- few records from oncology available  Hypertension and CV disease secondary prevention:  -Continue metoprolol, statin, aspirin  NIDDM:  -Hold orals -SSI with meals  Prostate cancer:  -Continue Casodex   DVT prophylaxis:  Lovenox   Code Status: Full Code   Family Communication: Daughter x 3 times  Disposition Plan:     Consultants:   Oncology  cards    Subjective: [name] yesterday when trying to stand  Objective: Vitals:   12/18/16 2124 12/19/16 0617 12/19/16 0827 12/19/16 1122  BP: 127/61 (!) 135/59 (!) 108/45 (!) 110/45  Pulse: (!) 59 80 92 73  Resp:  15 16 18   Temp: 98.2 F (36.8 C) 98.4  F (36.9 C) 98 F (36.7 C) 98.6 F (37 C)  TempSrc: Oral Oral Oral Oral  SpO2: 97% 100% 96% 98%  Weight:  66.7 kg (147 lb 1.6 oz)    Height:        Intake/Output Summary (Last 24 hours) at 12/19/16 1244 Last data filed at 12/19/16 1159  Gross per 24 hour  Intake              480 ml  Output              375 ml  Net              105 ml   Filed Weights   12/17/16 2348 12/19/16 0617  Weight: 65.9 kg (145 lb 4.8 oz) 66.7 kg (147 lb 1.6 oz)    Examination:  General exam: chronically ill  appearing Respiratory system: normal effort, diminished, no wheezing Cardiovascular system: rrr Gastrointestinal system: +BS, soft Central nervous system: Alert- defers everything to daughter Skin: No rashes, lesions or ulcers    Data Reviewed: I have personally reviewed following labs and imaging studies  CBC:  Recent Labs Lab 12/17/16 1838 12/18/16 0742 12/19/16 0553  WBC 7.0 6.3 7.2  NEUTROABS 5.1  --   --   HGB 10.2* 8.9* 10.4*  HCT 31.8* 28.4* 33.0*  MCV 91.6 89.0 89.9  PLT 158 131* 865   Basic Metabolic Panel:  Recent Labs Lab 12/17/16 1838 12/19/16 0553  NA 134* 137  K 4.7 4.4  CL 95* 100*  CO2 28 25  GLUCOSE 263* 128*  BUN 24* 12  CREATININE 1.24 0.98  CALCIUM 9.3 9.5   GFR: Estimated Creatinine Clearance: 50.1 mL/min (by C-G formula based on SCr of 0.98 mg/dL). Liver Function Tests:  Recent Labs Lab 12/17/16 1838  AST 46*  ALT 40  ALKPHOS 97  BILITOT 0.5  PROT 7.7  ALBUMIN 3.5    Recent Labs Lab 12/17/16 1838  LIPASE 110*   No results for input(s): AMMONIA in the last 168 hours. Coagulation Profile: No results for input(s): INR, PROTIME in the last 168 hours. Cardiac Enzymes:  Recent Labs Lab 12/17/16 1838 12/18/16 0117 12/18/16 0742  TROPONINI 0.10* 0.09* 0.08*   BNP (last 3 results) No results for input(s): PROBNP in the last 8760 hours. HbA1C:  Recent Labs  12/18/16 0742  HGBA1C 8.5*   CBG:  Recent Labs Lab 12/18/16 1130 12/18/16 1619 12/18/16 2117 12/19/16 0738 12/19/16 1120  GLUCAP 204* 222* 87 140* 297*   Lipid Profile: No results for input(s): CHOL, HDL, LDLCALC, TRIG, CHOLHDL, LDLDIRECT in the last 72 hours. Thyroid Function Tests: No results for input(s): TSH, T4TOTAL, FREET4, T3FREE, THYROIDAB in the last 72 hours. Anemia Panel: No results for input(s): VITAMINB12, FOLATE, FERRITIN, TIBC, IRON, RETICCTPCT in the last 72 hours. Urine analysis:    Component Value Date/Time   COLORURINE YELLOW  12/18/2016 [place] 12/18/2016 0759   LABSPEC 1.015 12/18/2016 0759   PHURINE 5.5 12/18/2016 0759   GLUCOSEU 500 (A) 12/18/2016 0759   HGBUR NEGATIVE 12/18/2016 0759   BILIRUBINUR NEGATIVE 12/18/2016 0759   KETONESUR NEGATIVE 12/18/2016 0759   PROTEINUR NEGATIVE 12/18/2016 0759   NITRITE NEGATIVE 12/18/2016 0759   LEUKOCYTESUR NEGATIVE 12/18/2016 0759     )No results found for this or any previous visit (from the past 240 hour(s)).    Anti-infectives    Start     Dose/Rate Route Frequency Ordered Stop   12/18/16 0130  azithromycin (ZITHROMAX) tablet  500 mg     500 mg Oral Daily 12/18/16 0104 12/25/16 0959   12/18/16 0115  cefTRIAXone (ROCEPHIN) 1 g in dextrose 5 % 50 mL IVPB     1 g 100 mL/hr over 30 Minutes Intravenous Every 24 hours 12/18/16 0104 12/25/16 0059       Radiology Studies: Dg Chest 2 View  Result Date: 12/17/2016 CLINICAL DATA:  Exertional shortness breath for several days. EXAM: CHEST  2 VIEW COMPARISON:  None. FINDINGS: Normal sized heart. Post CABG changes. Mild patchy opacities scattered throughout the right lung.a mall poorly defined nodular opacity in the left mid lung zone. Unremarkable bones. IMPRESSION: Mild patchy opacities in the right lung and small poorly defined nodular opacity in the left mid lung zone. These could represent infectious or post infectious changes. A chest CT with contrast may provide useful additional information. Electronically Signed   By: Claudie Revering M.D.   On: 12/17/2016 21:41   Ct Angio Chest Pe W Or Wo Contrast  Result Date: 12/18/2016 CLINICAL DATA:  Shortness of breath and weakness. Possible lung metastases. Cholangiocarcinoma. EXAM: CT ANGIOGRAPHY CHEST WITH CONTRAST TECHNIQUE: Multidetector CT imaging of the chest was performed using the standard protocol during bolus administration of intravenous contrast. Multiplanar CT image reconstructions and MIPs were obtained to evaluate the vascular anatomy. CONTRAST:   80 cc Isovue 370. COMPARISON:  Chest radiograph 12/17/2016 and CT abdomen pelvis 12/17/2016. FINDINGS: Cardiovascular: Image quality at the lung bases is degraded by respiratory motion. No definite pulmonary embolus. Atherosclerotic calcification of the arterial vasculature. Pulmonary arteries are borderline enlarged. Heart size within normal limits. No pericardial effusion. Mediastinum/Nodes: Mediastinal and hilar lymph nodes are not enlarged by CT size criteria. No axillary adenopathy. Esophagus is grossly unremarkable. Lungs/Pleura: Moderate centrilobular emphysema. Ill-defined areas of nodularity and nodular airspace consolidation are seen peripherally in the lungs bilaterally and appear largely peribronchovascular in distribution, rather than hematogenous in distribution. Nodularity is seen along the fissures. Additionally, there is somewhat of an upper and mid lung zone predominance. Septal thickening is not an overwhelming feature. No pleural fluid. Airway is unremarkable. Upper Abdomen: Pneumobilia with a partially imaged wall stent in the common bile duct. Adrenal glands are unremarkable. Persistent excretion of contrast from the kidneys from yesterday's exam. Low-attenuation lesion off the upper pole left kidney measures 2.5 cm and is likely a cyst. Visualized portions of the spleen, pancreas, stomach and bowel are grossly unremarkable. No upper abdominal adenopathy. Musculoskeletal: Degenerative changes in the spine. No worrisome lytic or sclerotic lesions. Review of the MIP images confirms the above findings. IMPRESSION: 1. Image quality at the lung bases is degraded by respiratory motion. Otherwise, no pulmonary embolus. 2. Perilymphatic distribution of pulmonary nodules and nodular consolidation. Differential diagnosis includes lymphangitic carcinomatosis in this patient with a reported history of cholangiocarcinoma. Sarcoid can also have this appearance. Atypical infection is considered less likely.  3.  Aortic atherosclerosis (ICD10-170.0). 4.  Emphysema (ICD10-J43.9). Electronically Signed   By: Lorin Picket M.D.   On: 12/18/2016 16:15   Ct Abdomen Pelvis W Contrast  Result Date: 12/17/2016 CLINICAL DATA:  Exertional shortness of breath, history of recently diagnosed cholangiocarcinoma EXAM: CT ABDOMEN AND PELVIS WITH CONTRAST TECHNIQUE: Multidetector CT imaging of the abdomen and pelvis was performed using the standard protocol following bolus administration of intravenous contrast. CONTRAST:  118mL ISOVUE-300 IOPAMIDOL (ISOVUE-300) INJECTION 61% COMPARISON:  None. FINDINGS: Lower chest: Lung bases demonstrate multiple, greater than 6, peripherally based nodules within the right lower lobe, right middle lobe,  lingula and left lower lobe. No pleural effusion or focal consolidation is seen. The heart is nonenlarged. Coronary artery calcifications. Hepatobiliary: Vague hypodense somewhat branching appearing structures in the right hepatic lobe. No focal mass is seen. Pneumobilia a with presence of biliary stent. Fluid or debris in the distal portion of the stent at the duodenum. Small air in the gallbladder. No calcified stones. Ill-defined soft tissue thickening/hypodensity at the distal portion of biliary stent Pancreas: Pancreatic duct is enlarged. Slight bloody appearance at the head of pancreas and second portion of duodenum Spleen: Multiple hypodense subcentimeter splenic lesions. Adrenals/Urinary Tract: Adrenal glands are within normal limits. The kidneys show no hydronephrosis. Cyst upper pole left kidney measuring 2.5 cm. Possible stones versus cortical calcifications in the lower pole of the left kidney. 15 mm cyst lower pole of the right kidney. Parapelvic cysts in the upper pole of the right kidney. Bladder unremarkable. Stomach/Bowel: Stomach is nonenlarged. No dilated small bowel. No colon wall thickening. Normal appendix. Vascular/Lymphatic: Extensive atherosclerotic calcifications of the  aorta. 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 16 mm peripancreatic lymph node. No significantly enlarged pelvic nodes. Reproductive: Enlarged prostate with mass effect on the posterior bladder. Metallic densities at the prostate gland. Other: No free air or free fluid.  Small fat in the umbilicus. Musculoskeletal: 11 mm anterolisthesis of L4 on L5 with fusion of the vertebral bodies. Bilateral pars defect at L4. Retrolisthesis of L5 on S1 with degenerative changes. No suspicious bone lesion. IMPRESSION: 1. Multiple subpleural bilateral lower lobe, right middle lobe and lingular pulmonary nodules suspicious for respiratory infection or atypical pneumonia. Metastatic nodules also possible given history. Non-contrast chest CT at 3-6 months is recommended. If the nodules are stable at time of repeat CT, then future CT at 18-24 months (from today's scan) is considered optional for low-risk patients, but is recommended for high-risk patients. This recommendation follows the consensus statement: Guidelines for Management of Incidental Pulmonary Nodules Detected on CT Images: From the Fleischner Society 2017; Radiology 2017; 284:228-243. 2. Pneumobilia with presence of biliary stent. Small amount of debris or tissue in the distal portion of the stent. Ill-defined hypodensity around the distal stent/head of pancreas, which may relate to history of cholangiocarcinoma. Pancreatic duct is enlarged. 3. Vague peripheral possibly branching hypodensities mostly in the right hepatic lobe, could relate to distal ductal dilatation. 4. Subcentimeter splenic lesions too small to further characterize. 5. Aneurysmal dilatation of the iliac arteries, on the right up to 2.9 cm and on the left up to 1.9 cm. 6. Enlarged heterogenous prostate gland with mass effect on the posterior bladder. 7. 11 mm anterolisthesis of L4 on L5 with bilateral pars defect at L4 Electronically Signed   By: Donavan Foil M.D.   On:  12/17/2016 22:14        Scheduled Meds: . aspirin EC  81 mg Oral Daily  . atorvastatin  10 mg Oral QHS  . azithromycin  500 mg Oral Daily  . bicalutamide  50 mg Oral Daily  . enoxaparin (LOVENOX) injection  40 mg Subcutaneous Q24H  . feeding supplement (ENSURE ENLIVE)  237 mL Oral BID BM  . gabapentin  100 mg Oral QHS  . insulin aspart  0-5 Units Subcutaneous QHS  . insulin aspart  0-9 Units Subcutaneous TID WC  . metoprolol tartrate  100 mg Oral Daily  . pneumococcal 23 valent vaccine  0.5 mL Intramuscular Tomorrow-1000   Continuous Infusions: . cefTRIAXone (ROCEPHIN)  IV Stopped (12/19/16  8413)     LOS: 1 day    Time spent: 25 min    [place], DO [facility] Pager [phone]  If 7PM-7AM, please contact night-coverage [email or web address] Password [email or web address] 12/19/2016, 12:44 PM

## 2016-12-19 NOTE — Progress Notes (Signed)
Pt teaching includes instruction about VTE prophylaxis and the possibility of developing a blood clot when you are bed ridden most of the day and very inactive. Nonpharmacologic  prevention would include active mobility and range of motion. Patient is not currently attached to SCD's.

## 2016-12-19 NOTE — Progress Notes (Addendum)
Pt's daughter talked with MD this morning who was so anxious regarding her father's treatment. According to her she had an appointment with oncologist this am at [facility]. Due to her father being here and being weak to discharge him at this point, MD is following up with oncologist at some point for the second opinion. Daughter left the room and asked RN to call her back when the oncologist will be here to evaluate the patient.  This am PT worked with the patient and recommended SNF,no any complain of pain, tolerating diet and meds, DC tele-monitor, informed to CCMD, will continue to monitor.  Palma Holter, RN

## 2016-12-19 NOTE — Plan of Care (Signed)
Problem: Safety: Goal: Ability to remain free from injury will improve Outcome: Not Met (add Reason) Pt. Continues to attempt to go out of bed unassisted post fall

## 2016-12-19 NOTE — Progress Notes (Signed)
Progress Note  Patient Name: Willie Bruce Date of Encounter: 12/19/2016  Primary Cardiologist: Dr. Debara Pickett   Subjective   Had fall yesterday due to balance issue. No chest pain or palpitations.   Inpatient Medications    Scheduled Meds: . aspirin EC  81 mg Oral Daily  . atorvastatin  10 mg Oral QHS  . azithromycin  500 mg Oral Daily  . bicalutamide  50 mg Oral Daily  . enoxaparin (LOVENOX) injection  40 mg Subcutaneous Q24H  . feeding supplement (ENSURE ENLIVE)  237 mL Oral BID BM  . gabapentin  100 mg Oral QHS  . insulin aspart  0-5 Units Subcutaneous QHS  . insulin aspart  0-9 Units Subcutaneous TID WC  . metoprolol tartrate  100 mg Oral Daily  . pneumococcal 23 valent vaccine  0.5 mL Intramuscular Tomorrow-1000   Continuous Infusions: . cefTRIAXone (ROCEPHIN)  IV Stopped (12/19/16 0433)   PRN Meds: acetaminophen **OR** acetaminophen, ondansetron **OR** ondansetron (ZOFRAN) IV   Vital Signs    Vitals:   12/18/16 1725 12/18/16 2124 12/19/16 0617 12/19/16 0827  BP: (!) 133/56 127/61 (!) 135/59 (!) 108/45  Pulse: 66 (!) 59 80 92  Resp:   15 16  Temp: 97.6 F (36.4 C) 98.2 F (36.8 C) 98.4 F (36.9 C) 98 F (36.7 C)  TempSrc: Oral Oral Oral Oral  SpO2: 99% 97% 100% 96%  Weight:   147 lb 1.6 oz (66.7 kg)   Height:        Intake/Output Summary (Last 24 hours) at 12/19/16 1117 Last data filed at 12/19/16 1030  Gross per 24 hour  Intake              480 ml  Output                0 ml  Net              480 ml   Filed Weights   12/17/16 2348 12/19/16 0617  Weight: 145 lb 4.8 oz (65.9 kg) 147 lb 1.6 oz (66.7 kg)    Telemetry    Patient has refused it  ECG    None today  Physical Exam   GEN: Ill-appearing male in no acute distress. Neck: No JVD Cardiac: RRR, no murmurs, rubs, or gallops.  Respiratory: Clear to auscultation bilaterally. GI: Soft, nontender, non-distended  MS: No edema; No deformity. Neuro:  Nonfocal  Psych: Normal affect   Labs      Chemistry Recent Labs Lab 12/17/16 1838 12/19/16 0553  NA 134* 137  K 4.7 4.4  CL 95* 100*  CO2 28 25  GLUCOSE 263* 128*  BUN 24* 12  CREATININE 1.24 0.98  CALCIUM 9.3 9.5  PROT 7.7  --   ALBUMIN 3.5  --   AST 46*  --   ALT 40  --   ALKPHOS 97  --   BILITOT 0.5  --   GFRNONAA 50* >60  GFRAA 58* >60  ANIONGAP 11 12     Hematology Recent Labs Lab 12/17/16 1838 12/18/16 0742 12/19/16 0553  WBC 7.0 6.3 7.2  RBC 3.47* 3.19* 3.67*  HGB 10.2* 8.9* 10.4*  HCT 31.8* 28.4* 33.0*  MCV 91.6 89.0 89.9  MCH 29.4 27.9 28.3  MCHC 32.1 31.3 31.5  RDW 14.1 14.3 14.4  PLT 158 131* 175    Cardiac Enzymes Recent Labs Lab 12/17/16 1838 12/18/16 0117 12/18/16 0742  TROPONINI 0.10* 0.09* 0.08*   No results for input(s): TROPIPOC in the  last 168 hours.   BNP Recent Labs Lab 12/17/16 1838  BNP 87.3     DDimer No results for input(s): DDIMER in the last 168 hours.   Radiology    Dg Chest 2 View  Result Date: 12/17/2016 CLINICAL DATA:  Exertional shortness breath for several days. EXAM: CHEST  2 VIEW COMPARISON:  None. FINDINGS: Normal sized heart. Post CABG changes. Mild patchy opacities scattered throughout the right lung.a mall poorly defined nodular opacity in the left mid lung zone. Unremarkable bones. IMPRESSION: Mild patchy opacities in the right lung and small poorly defined nodular opacity in the left mid lung zone. These could represent infectious or post infectious changes. A chest CT with contrast may provide useful additional information. Electronically Signed   By: Claudie Revering M.D.   On: 12/17/2016 21:41   Ct Angio Chest Pe W Or Wo Contrast  Result Date: 12/18/2016 CLINICAL DATA:  Shortness of breath and weakness. Possible lung metastases. Cholangiocarcinoma. EXAM: CT ANGIOGRAPHY CHEST WITH CONTRAST TECHNIQUE: Multidetector CT imaging of the chest was performed using the standard protocol during bolus administration of intravenous contrast. Multiplanar CT  image reconstructions and MIPs were obtained to evaluate the vascular anatomy. CONTRAST:  80 cc Isovue 370. COMPARISON:  Chest radiograph 12/17/2016 and CT abdomen pelvis 12/17/2016. FINDINGS: Cardiovascular: Image quality at the lung bases is degraded by respiratory motion. No definite pulmonary embolus. Atherosclerotic calcification of the arterial vasculature. Pulmonary arteries are borderline enlarged. Heart size within normal limits. No pericardial effusion. Mediastinum/Nodes: Mediastinal and hilar lymph nodes are not enlarged by CT size criteria. No axillary adenopathy. Esophagus is grossly unremarkable. Lungs/Pleura: Moderate centrilobular emphysema. Ill-defined areas of nodularity and nodular airspace consolidation are seen peripherally in the lungs bilaterally and appear largely peribronchovascular in distribution, rather than hematogenous in distribution. Nodularity is seen along the fissures. Additionally, there is somewhat of an upper and mid lung zone predominance. Septal thickening is not an overwhelming feature. No pleural fluid. Airway is unremarkable. Upper Abdomen: Pneumobilia with a partially imaged wall stent in the common bile duct. Adrenal glands are unremarkable. Persistent excretion of contrast from the kidneys from yesterday's exam. Low-attenuation lesion off the upper pole left kidney measures 2.5 cm and is likely a cyst. Visualized portions of the spleen, pancreas, stomach and bowel are grossly unremarkable. No upper abdominal adenopathy. Musculoskeletal: Degenerative changes in the spine. No worrisome lytic or sclerotic lesions. Review of the MIP images confirms the above findings. IMPRESSION: 1. Image quality at the lung bases is degraded by respiratory motion. Otherwise, no pulmonary embolus. 2. Perilymphatic distribution of pulmonary nodules and nodular consolidation. Differential diagnosis includes lymphangitic carcinomatosis in this patient with a reported history of  cholangiocarcinoma. Sarcoid can also have this appearance. Atypical infection is considered less likely. 3.  Aortic atherosclerosis (ICD10-170.0). 4.  Emphysema (ICD10-J43.9). Electronically Signed   By: Lorin Picket M.D.   On: 12/18/2016 16:15   Ct Abdomen Pelvis W Contrast  Result Date: 12/17/2016 CLINICAL DATA:  Exertional shortness of breath, history of recently diagnosed cholangiocarcinoma EXAM: CT ABDOMEN AND PELVIS WITH CONTRAST TECHNIQUE: Multidetector CT imaging of the abdomen and pelvis was performed using the standard protocol following bolus administration of intravenous contrast. CONTRAST:  138mL ISOVUE-300 IOPAMIDOL (ISOVUE-300) INJECTION 61% COMPARISON:  None. FINDINGS: Lower chest: Lung bases demonstrate multiple, greater than 6, peripherally based nodules within the right lower lobe, right middle lobe, lingula and left lower lobe. No pleural effusion or focal consolidation is seen. The heart is nonenlarged. Coronary artery calcifications. Hepatobiliary: Vague  hypodense somewhat branching appearing structures in the right hepatic lobe. No focal mass is seen. Pneumobilia a with presence of biliary stent. Fluid or debris in the distal portion of the stent at the duodenum. Small air in the gallbladder. No calcified stones. Ill-defined soft tissue thickening/hypodensity at the distal portion of biliary stent Pancreas: Pancreatic duct is enlarged. Slight bloody appearance at the head of pancreas and second portion of duodenum Spleen: Multiple hypodense subcentimeter splenic lesions. Adrenals/Urinary Tract: Adrenal glands are within normal limits. The kidneys show no hydronephrosis. Cyst upper pole left kidney measuring 2.5 cm. Possible stones versus cortical calcifications in the lower pole of the left kidney. 15 mm cyst lower pole of the right kidney. Parapelvic cysts in the upper pole of the right kidney. Bladder unremarkable. Stomach/Bowel: Stomach is nonenlarged. No dilated small bowel. No  colon wall thickening. Normal appendix. Vascular/Lymphatic: Extensive atherosclerotic calcifications of the aorta. 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 16 mm peripancreatic lymph node. No significantly enlarged pelvic nodes. Reproductive: Enlarged prostate with mass effect on the posterior bladder. Metallic densities at the prostate gland. Other: No free air or free fluid.  Small fat in the umbilicus. Musculoskeletal: 11 mm anterolisthesis of L4 on L5 with fusion of the vertebral bodies. Bilateral pars defect at L4. Retrolisthesis of L5 on S1 with degenerative changes. No suspicious bone lesion. IMPRESSION: 1. Multiple subpleural bilateral lower lobe, right middle lobe and lingular pulmonary nodules suspicious for respiratory infection or atypical pneumonia. Metastatic nodules also possible given history. Non-contrast chest CT at 3-6 months is recommended. If the nodules are stable at time of repeat CT, then future CT at 18-24 months (from today's scan) is considered optional for low-risk patients, but is recommended for high-risk patients. This recommendation follows the consensus statement: Guidelines for Management of Incidental Pulmonary Nodules Detected on CT Images: From the Fleischner Society 2017; Radiology 2017; 284:228-243. 2. Pneumobilia with presence of biliary stent. Small amount of debris or tissue in the distal portion of the stent. Ill-defined hypodensity around the distal stent/head of pancreas, which may relate to history of cholangiocarcinoma. Pancreatic duct is enlarged. 3. Vague peripheral possibly branching hypodensities mostly in the right hepatic lobe, could relate to distal ductal dilatation. 4. Subcentimeter splenic lesions too small to further characterize. 5. Aneurysmal dilatation of the iliac arteries, on the right up to 2.9 cm and on the left up to 1.9 cm. 6. Enlarged heterogenous prostate gland with mass effect on the posterior bladder. 7. 11 mm  anterolisthesis of L4 on L5 with bilateral pars defect at L4 Electronically Signed   By: Donavan Foil M.D.   On: 12/17/2016 22:14    Cardiac Studies   Echo 12/18/16 Study Conclusions  - Left ventricle: The cavity size was normal. Systolic function was   normal. The estimated ejection fraction was in the range of 55%   to 60%. Wall motion was normal; there were no regional wall   motion abnormalities. Doppler parameters are consistent with   abnormal left ventricular relaxation (grade 1 diastolic   dysfunction). - Aortic valve: Transvalvular velocity was within the normal range.   There was no stenosis. There was mild regurgitation. - Mitral valve: Transvalvular velocity was within the normal range.   There was no evidence for stenosis. There was trivial   regurgitation. - Right ventricle: The cavity size was normal. Wall thickness was   normal. Systolic function was normal. - Tricuspid valve: There was trivial regurgitation. - Pulmonary arteries: Systolic  pressure was within the normal   range. PA peak pressure: 22 mm Hg (S).  Patient Profile     83 y.o. male with a hx of CAD s/p remote CABG x 3 (20 years ago), HTN, DM, anemia, prostate cancer, cholangiocarcinoma and prior tobacco abuse (>50 pack year, quit 10 years ago) presented with worsening DOE. Cardiology is asked to see for elevated troponin.  Assessment & Plan    1. Elevated troponin - Troponin trending down. Uncertain etiology. Could be due to demand ischemia from respiratory illness. No chest pain. EKG without acute ischemic changes. No need for inpatient ischemic eval. Echo showed normal LVEF with grade 1 DD.   2. CAD s/p remote CABG - No angina. Continue aspirin, statin, beta blocker.   3. DOE - BNP normal. No signs of heart failure. Echo showed normal EF. PA pressure of 22 mm Hg. CTA of chest did not showed PE. However showed emphysema. Concern for metastatic disease vs sarcoid.   4.  Cholangiocarcinoma  -  concern for metastasis diease. Per primary. ? Oncology evaluation here vs [facility].   5. HTN - Stable on current management.  Jarrett Soho, PA  12/19/2016, 11:17 AM

## 2016-12-20 DIAGNOSIS — E43 Unspecified severe protein-calorie malnutrition: Secondary | ICD-10-CM

## 2016-12-20 LAB — GLUCOSE, CAPILLARY
GLUCOSE-CAPILLARY: 197 mg/dL — AB (ref 65–99)
GLUCOSE-CAPILLARY: 273 mg/dL — AB (ref 65–99)
Glucose-Capillary: 220 mg/dL — ABNORMAL HIGH (ref 65–99)
Glucose-Capillary: 371 mg/dL — ABNORMAL HIGH (ref 65–99)

## 2016-12-20 NOTE — Clinical Social Work Note (Signed)
Clinical Social Work Assessment  Patient Details  Name: Willie Bruce MRN: [id number] Date of Birth: 11-10-1926  Date of referral:  12/20/16               Reason for consult:  Facility Placement, Discharge Planning                Permission sought to share information with:  Facility Sport and exercise psychologist, Family Supports Permission granted to share information::  Yes, Verbal Permission Granted  Name::     Katheran Yordan  Agency::  SNF's  Relationship::  Daughter  Contact Information:  [phone]  Housing/Transportation Living arrangements for the past 2 months:  [place] of Information:  Patient, Medical Team, Adult Children Patient Interpreter Needed:  None Criminal Activity/Legal Involvement Pertinent to Current Situation/Hospitalization:  No - Comment as needed Significant Relationships:  Adult Children Lives with:  Adult Children Do you feel safe going back to the place where you live?  Yes Need for family participation in patient care:  Yes (Comment)  Care giving concerns:  PT recommending SNF once medically stable for discharge.   Social Worker assessment / plan:  CSW met with patient. No supports at bedside. Patient deferred decision-making to his daughter [name]. CSW called Ms. Sawyers. CSW introduced role and explained that PT recommendations would be discussed. Patient's daughter agreeable to SNF placement with palliative care. Per palliative team, after rehab the plan is for him to return home with hospice. No preference of facility. CSW emailed a SNF list to patient's daughter for review. No further concerns. CSW encouraged patient's daughter to contact CSW as needed. CSW will continue to follow patient and his daughter for support and facilitate discharge to SNF once medically stable.  Employment status:  Retired Forensic scientist:  Medicare PT Recommendations:  [place] / Referral to community resources:  [place]  Patient/Family's Response to care:  Patient deferred decision making to his daughter. Patient's daughter agreeable to SNF placement. Patient's daughter supportive and involved in patient's care. Patient and his daughter appreciated social work intervention.  Patient/Family's Understanding of and Emotional Response to Diagnosis, Current Treatment, and Prognosis:  Patient and his daughter have a good understanding of the reason for admission, prognosis, and his need for rehab prior to returning home. Patient and his daughter appear happy with hospital care.  Emotional Assessment Appearance:  Appears stated age Attitude/Demeanor/Rapport:  Other (Pleasant) Affect (typically observed):  Accepting, Appropriate, Calm, Pleasant Orientation:  Oriented to Self, Oriented to Place, Oriented to  Time, Oriented to Situation Alcohol / Substance use:  Never Used Psych involvement (Current and /or in the community):  No (Comment)  Discharge Needs  Concerns to be addressed:  Care Coordination Readmission within the last 30 days:  No Current discharge risk:  Dependent with Mobility Barriers to Discharge:  Continued Medical Work up, Other (On Medicare inpatient day 2 of 3.)   Candie Chroman, LCSW 12/20/2016, 4:17 PM

## 2016-12-20 NOTE — Consult Note (Signed)
Consultation Note Date: 12/20/2016   Patient Name: Willie Bruce  DOB: December 15, 1926  MRN: [id number]  Age / Sex: 83 y.o., male  PCP: [facility], [place] Not In Referring Physician: Geradine Girt, DO  Reason for Consultation: Establishing goals of care  HPI/Patient Profile: 83 y.o. male  admitted on 12/17/2016    Life limiting illness: extra hepatic cholangiocarcinoma, pulmonary metastasis, deconditioning.   Clinical Assessment and Goals of Care:  Willie Bruce a 83 y.o.malewith a past medical history significant for HTN, NIDDM, anemia, prostate cancer and cholangiocarcinoma.   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 Saw Oncology in [facility] where he lived, was offered radiation or chemo for local control, but did not consult with a surgeon for definitive therapy, because it was felt he was not a surgical candidate for the necessary surgery. It appears he was mulling over his decisions from Feb to May, when he saw Onc again and told them he had decided to move to [place] to pursue therapy down here.  Since then, he has moved to [facility] and is living with a daughter Katheran Masaji.He was able to ambulate and function until this weekend, he started to be extremely weak again, needed help just walking, getting out of a chair.    Hospital course is significant for imaging showing local lymph node spread, as well as lung involvement seen on CT chest. He has been seen by Dr Burr Medico from oncology, final recommendations were to pursue and more hospice/comfort based approach to his care. He has been seen by PT, recommending SNF. A palliative consult has been placed for goals of care recommendations.   The patient is resting in bed, he is awake alert, denies any pain. I introduced myself and palliative care as follows: Palliative  medicine is specialized medical care for people living with serious illness. It focuses on providing relief from the symptoms and stress of a serious illness. The goal is to improve quality of life for both the patient and the family.  Patient states, " The [facility] has been good to me, I'm not in any pain. Please talk to my daughter [name]" Call placed and discussed with [name] in detail, we talked about his cancer disease trajectory, values, goals and wishes, advanced directives and disposition arrangements. We also talked about hospice philosophy of care and how hospice services might be able to help once the patient completes his rehab attempt, please see recommendations below, thank you for the consult.   NEXT OF KIN  daughter Katheran Leeandre is his next of kin, patient has no spouse, has one other adult child living in [place], [place] Patient elects his daughter Katheran Bingham to be his designated spokesperson who can make any and all decisions on his behalf.   SUMMARY OF RECOMMENDATIONS    SNF rehab with palliative services following on discharge, home with hospice services after that. Discussed in detail with daughter Ms Bruce Donath.   Full code for now, again,  discussed in detail with Ms Bruce Donath, she is considering DNR DNI, but wants to have further discussions with the patient first.   A family attorney is already preparing advanced directives such as a living will and a power of attorney, including health care power of attorney designating Ms Bruce Donath as his HCPOA.   Continue current mode of care for now.  Social work consult for SNF rehab arrangements.  Thank you for the consult.   Code Status/Advance Care Planning:  Full code    Symptom Management:     Continue with current mode of care.  Palliative Prophylaxis:   Bowel Regimen     Psycho-social/Spiritual:   Desire for further Chaplaincy support:no  Additional Recommendations: Education on Hospice  Prognosis:   < 6 months  Discharge  Planning: [place] for rehab with Palliative care service follow-up, and then home with hospice, under the care of his daughter Katheran Laquon [phone].       Primary Diagnoses: Present on Admission: . Atypical pneumonia . CKD (chronic kidney disease), stage III . Cholangiocarcinoma ([place]) . Prostate cancer ([place]) . Normocytic anemia . Elevated troponin . Type 2 diabetes mellitus with hyperosmolarity without coma, without long-term current use of insulin ([place]) . Essential hypertension . Coronary artery disease due to lipid rich plaque   I have reviewed the medical record, interviewed the patient and family, and examined the patient. The following aspects are pertinent.  Past Medical History:  Diagnosis Date  . Cancer ([place])   . Coronary artery disease   . Diabetes mellitus without complication ([place])   . High cholesterol   . Hypertension    Social History   Social History  . Marital status: Divorced    Spouse name: N/A  . Number of children: N/A  . Years of education: N/A   Social History Main Topics  . Smoking status: Never Smoker  . Smokeless tobacco: Never Used  . Alcohol use None  . Drug use: Unknown  . Sexual activity: Not Asked   Other Topics Concern  . None   Social History Narrative  . None   Family History  Problem Relation Age of Onset  . Lung cancer Sister    Scheduled Meds: . aspirin EC  81 mg Oral Daily  . atorvastatin  10 mg Oral QHS  . azithromycin  500 mg Oral Daily  . bicalutamide  50 mg Oral Daily  . enoxaparin (LOVENOX) injection  40 mg Subcutaneous Q24H  . feeding supplement (ENSURE ENLIVE)  237 mL Oral BID BM  . gabapentin  100 mg Oral QHS  . insulin aspart  0-5 Units Subcutaneous QHS  . insulin aspart  0-9 Units Subcutaneous TID WC  . metoprolol tartrate  100 mg Oral Daily  . pneumococcal 23 valent vaccine  0.5 mL Intramuscular Tomorrow-1000   Continuous Infusions: . cefTRIAXone (ROCEPHIN)  IV Stopped (12/20/16 0038)    PRN Meds:.acetaminophen **OR** acetaminophen, ondansetron **OR** ondansetron (ZOFRAN) IV Medications Prior to Admission:  Prior to Admission medications   Medication Sig Start Date End Date Taking? Authorizing Provider  aspirin EC 81 MG tablet Take 81 mg by mouth daily.   Yes [provider]  atorvastatin (LIPITOR) 10 MG tablet Take 10 mg by mouth at bedtime.   Yes [provider]  bicalutamide (CASODEX) 50 MG tablet Take 50 mg by mouth daily. 09/19/16  Yes [provider]  FERROUS SULFATE PO Take 1 tablet by mouth daily.   Yes [provider]  gabapentin (  NEURONTIN) 100 MG capsule Take 100 mg by mouth at bedtime. 11/03/16  Yes [provider]  leuprolide (LUPRON) 30 MG injection Inject 30 mg into the muscle daily.   Yes [provider]  meclizine (ANTIVERT) 25 MG tablet Take 25 mg by mouth 3 (three) times daily as needed for dizziness.   Yes [provider]  meloxicam (MOBIC) 7.5 MG tablet Take 7.5 mg by mouth daily.   Yes [provider]  metoprolol tartrate (LOPRESSOR) 100 MG tablet Take 100 mg by mouth daily.   Yes [provider]  repaglinide (PRANDIN) 1 MG tablet Take 2 mg by mouth 3 (three) times daily before meals.   Yes [provider]  tamsulosin (FLOMAX) 0.4 MG CAPS capsule Take 0.4 mg by mouth daily.   Yes [provider]   No Known Allergies Review of Systems Denies pain  Physical Exam Elderly gentleman resting in bed No acute distress noted Clear regular pattern of breathing S1 S2 No edema Awake alert In no distress  Vital Signs: BP 136/64 (BP Location: Right Arm)   Pulse 78   Temp 98 F (36.7 C) (Oral)   Resp 17   Ht 5\' 11"  (1.803 m)   Wt 63.7 kg (140 lb 6.4 oz) Comment: Scale C  SpO2 95%   BMI 19.58 kg/m  Pain Assessment: No/denies pain   Pain Score: 0-No pain   SpO2: SpO2: 95 % O2 Device:SpO2: 95 % O2 Flow Rate: .   IO: Intake/output summary:    Intake/Output Summary (Last 24 hours) at 12/20/16 1051 Last data filed at 12/20/16 6503  Gross per 24 hour  Intake              560 ml  Output             1725 ml  Net            -1165 ml    LBM: Last BM Date: 12/19/16 Baseline Weight: Weight: 65.9 kg (145 lb 4.8 oz) (C scale) Most recent weight: Weight: 63.7 kg (140 lb 6.4 oz) (Scale C)     Palliative Assessment/Data:   Flowsheet Rows     Most Recent Value  Intake Tab  Referral Department  Hospitalist  Unit at Time of Referral  Med/Surg Unit  Palliative Care Primary Diagnosis  Cancer  Palliative Care Type  New Palliative care  Reason for referral  Clarify Goals of Care  Date first seen by Palliative Care  12/20/16  Clinical Assessment  Palliative Performance Scale Score  30%  Pain Max last 24 hours  0  Pain Min Last 24 hours  0  Dyspnea Max Last 24 Hours  0  Dyspnea Min Last 24 hours  0  Psychosocial & Spiritual Assessment  Palliative Care Outcomes  Patient/Family meeting held?  Yes  Who was at the meeting?  patient, daughter.   Palliative Care Outcomes  Clarified goals of care      Time In:  9.30 Time Out:  10.40 Time Total:  70 min  Greater than 50%  of this time was spent counseling and coordinating care related to the above assessment and plan.  Signed by: Loistine Chance, MD [phone]  Please contact Palliative Medicine Team phone at [phone] for questions and concerns.  For individual provider: See Shea Evans

## 2016-12-20 NOTE — NC FL2 (Signed)
[place] LEVEL OF CARE SCREENING TOOL     IDENTIFICATION  Patient Name: Willie Bruce Birthdate: 1927-02-12 Sex: male Admission Date (Current Location): 12/17/2016  [facility] and [place] Number:  Herbalist and Address:  [facility] [place], [place] [address] [place], [place] [address]      Provider Number: [id number]  Attending Physician Name and Address:  Geradine Girt, DO  Relative Name and Phone Number:       Current Level of Care: Hospital Recommended Level of Care: [place] (with palliative) Prior Approval Number:    Date Approved/Denied:   PASRR Number: [id number]  Discharge Plan: SNF (with palliative)    Current Diagnoses: Patient Active Problem List   Diagnosis Date Noted  . Lung nodules 12/19/2016  . Protein-calorie malnutrition, severe 12/19/2016  . Atypical pneumonia 12/18/2016  . CKD (chronic kidney disease), stage III 12/18/2016  . Normocytic anemia 12/18/2016  . Elevated troponin 12/18/2016  . Type 2 diabetes mellitus with hyperosmolarity without coma, without long-term current use of insulin ([place]) 12/18/2016  . Essential hypertension 12/18/2016  . Coronary artery disease due to lipid rich plaque 12/18/2016  . Weakness 12/17/2016  . Cholangiocarcinoma ([place]) 09/07/2016  . Prostate cancer ([place]) 09/06/2016    Orientation RESPIRATION BLADDER Height & Weight     Self, Time, Situation, Place  Normal Continent, External catheter Weight: 140 lb 6.4 oz (63.7 kg) (Scale C) Height:  5\' 11"  (180.3 cm)  BEHAVIORAL SYMPTOMS/MOOD NEUROLOGICAL BOWEL NUTRITION STATUS   (None)  (None) Continent Diet (Regular)  AMBULATORY STATUS COMMUNICATION OF NEEDS Skin   Limited Assist Verbally Normal                       Personal Care Assistance Level of Assistance  Bathing, Feeding, Dressing Bathing Assistance: Limited assistance Feeding assistance: Independent Dressing Assistance: Limited assistance      Functional Limitations Info  Sight, Hearing, Speech Sight Info: Adequate Hearing Info: Adequate Speech Info: Adequate    SPECIAL CARE FACTORS FREQUENCY  PT (By licensed PT), Blood pressure     PT Frequency: 5 x week              Contractures Contractures Info: Not present    Additional Factors Info  Code Status, Allergies Code Status Info: Full Allergies Info: NKDA           Current Medications (12/20/2016):  This is the current hospital active medication list Current Facility-Administered Medications  Medication Dose Route Frequency Provider Last Rate Last Dose  . acetaminophen (TYLENOL) tablet 650 mg  650 mg Oral Q6H PRN Danford, Suann Larry, MD       Or  . acetaminophen (TYLENOL) suppository 650 mg  650 mg Rectal Q6H PRN Danford, Suann Larry, MD      . aspirin EC tablet 81 mg  81 mg Oral Daily Danford, Suann Larry, MD   81 mg at 12/20/16 1011  . atorvastatin (LIPITOR) tablet 10 mg  10 mg Oral QHS Edwin Dada, MD   10 mg at 12/19/16 2138  . azithromycin (ZITHROMAX) tablet 500 mg  500 mg Oral Daily Edwin Dada, MD   500 mg at 12/20/16 1011  . bicalutamide (CASODEX) tablet 50 mg  50 mg Oral Daily Danford, Suann Larry, MD   50 mg at 12/20/16 1011  . cefTRIAXone (ROCEPHIN) 1 g in dextrose 5 % 50 mL IVPB  1 g Intravenous Q24H Danford, Suann Larry, MD   Stopped  at 12/20/16 0038  . enoxaparin (LOVENOX) injection 40 mg  40 mg Subcutaneous Q24H Danford, Suann Larry, MD   40 mg at 12/20/16 1011  . feeding supplement (ENSURE ENLIVE) (ENSURE ENLIVE) liquid 237 mL  237 mL Oral BID BM Danford, Suann Larry, MD   237 mL at 12/20/16 1000  . gabapentin (NEURONTIN) capsule 100 mg  100 mg Oral QHS Edwin Dada, MD   100 mg at 12/19/16 2138  . insulin aspart (novoLOG) injection 0-5 Units  0-5 Units Subcutaneous QHS Danford, Christopher P, MD      . insulin aspart (novoLOG) injection 0-9 Units  0-9 Units Subcutaneous TID WC Edwin Dada, MD   9 Units at 12/20/16 1202  . metoprolol tartrate (LOPRESSOR) tablet 100 mg  100 mg Oral Daily Danford, Suann Larry, MD   100 mg at 12/20/16 1011  . ondansetron (ZOFRAN) tablet 4 mg  4 mg Oral Q6H PRN Danford, Suann Larry, MD       Or  . ondansetron (ZOFRAN) injection 4 mg  4 mg Intravenous Q6H PRN Danford, Suann Larry, MD      . pneumococcal 23 valent vaccine (PNU-IMMUNE) injection 0.5 mL  0.5 mL Intramuscular Tomorrow-1000 Danford, Suann Larry, MD         Discharge Medications: Please see discharge summary for a list of discharge medications.  Relevant Imaging Results:  Relevant Lab Results:   Additional Information SS#: [id number]. Not on any cancer treatment.  Candie Chroman, LCSW

## 2016-12-20 NOTE — Clinical Social Work Placement (Signed)
   CLINICAL SOCIAL WORK PLACEMENT  NOTE  Date:  12/20/2016  Patient Details  Name: Willie Bruce MRN: [id number] Date of Birth: Nov 02, 1926  Clinical Social Work is seeking post-discharge placement for this patient at the [place] level of care (*CSW will initial, date and re-position this form in  chart as items are completed):  Yes   Patient/family provided with [facility] Work Department's list of facilities offering this level of care within the geographic area requested by the patient (or if unable, by the patient's family).  Yes   Patient/family informed of their freedom to choose among providers that offer the needed level of care, that participate in Medicare, Medicaid or managed care program needed by the patient, have an available bed and are willing to accept the patient.  Yes   Patient/family informed of [facility]'s ownership interest in [facility] and [facility], as well as of the fact that they are under no obligation to receive care at these facilities.  PASRR submitted to EDS on 12/20/16     PASRR number received on 12/20/16     Existing PASRR number confirmed on       FL2 transmitted to all facilities in geographic area requested by pt/family on 12/20/16     FL2 transmitted to all facilities within larger geographic area on       Patient informed that his/her managed care company has contracts with or will negotiate with certain facilities, including the following:            Patient/family informed of bed offers received.  Patient chooses bed at       Physician recommends and patient chooses bed at      Patient to be transferred to   on  .  Patient to be transferred to facility by       Patient family notified on   of transfer.  Name of family member notified:        PHYSICIAN       Additional Comment:    _______________________________________________ Candie Chroman, LCSW 12/20/2016, 4:20 PM

## 2016-12-20 NOTE — Progress Notes (Signed)
PROGRESS NOTE    Terrian Sentell  UMP:[id number] DOB: Sep 25, 1926 DOA: 12/17/2016 PCP: System, Pcp Not In   Outpatient Specialists:     Brief Narrative:  Willie Bruce is a 83 y.o. male with a past medical history significant for HTN, NIDDM, anemia, prostate cancer and cholangiocarcinoma who presents with 1 week slowly progressive weakness.  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  Saw Oncology in [facility] where he lived, was offered radiation or chemo for local control, but did not consult with a surgeon for definitive therapy, because it was felt he was not a surgical candidate for the necessary surgery.  It appears he was mulling over his decisions from Feb to May, when he saw Onc again and told them he had decided to move to [place] to pursue therapy down here. Since then, he has moved to [facility] and is living with a daughter.  He was able to ambulate and function until this weekend, he started to be extremely weak again, needed help just walking, getting out of a chair.  Per report he also appeared dyspneic and was intermittently confused, so family brought him in.  At no point did he have fever, chills.  At no point did he have cough, sputum production.  At no point did he have new abdominal pain, vomiting, diarrhea, nausea.  At no point did he have chest pain, diaphoresis or nausea, just that he was slowly progressively more fatigued with exertion over the weekend.  He was seen at [facility] that did a CXR showing patching nodular opacities in the lungs and was sent to [facility] for pneumonia.   Assessment & Plan:   Principal Problem:   Weakness Active Problems:   Atypical pneumonia   CKD (chronic kidney disease), stage III   Cholangiocarcinoma (HCC)   Prostate cancer (HCC)   Normocytic anemia   Elevated troponin   Type 2 diabetes mellitus with hyperosmolarity without coma, without long-term current use  of insulin (HCC)   Essential hypertension   Coronary artery disease due to lipid rich plaque   Lung nodules   Protein-calorie malnutrition, severe  Cancer of gallbladder:  With local lymph node spread as well as lung involvement seen on most recent CTA of chest -spoke with Dr. Kate Sable-- rad onc at [facility] and no role for radiation at this time. -family asked for 2nd opinion- appreciate Dr. Ernestina Penna consult--- recommend palliative care -palliative care to see today  Weakness:  -? progression of his cancer and FTT.  -PT eval- suspect patient will need SNF with palliative to follow -dc abx as nodes are most likely from cancer   Elevated troponin:  Stable -echo reviewed -cardiology consult appreciated-- no need for further work up  Anemia:  -Trend CBC -await complete outside records from [facility]- few records from oncology available  Hypertension and CV disease secondary prevention:  -Continue metoprolol, statin, aspirin  NIDDM:  -Hold orals -SSI with meals  Prostate cancer:  -Continue Casodex   DVT prophylaxis:  Lovenox   Code Status: Full Code   Family Communication: Daughter 6/12  Disposition Plan:  SNF with palliative services   Consultants:   Oncology  cards    Subjective: Excited about his birthday tomm No SOB, no CP  Objective: Vitals:   12/19/16 0827 12/19/16 1122 12/19/16 2240 12/20/16 0742  BP: (!) 108/45 (!) 110/45 (!) 112/52 136/64  Pulse: 92 73 70 78  Resp: 16 18  17 17  Temp: 98 F (36.7 C) 98.6 F (37 C) 98.4 F (36.9 C) 98 F (36.7 C)  TempSrc: Oral Oral Oral Oral  SpO2: 96% 98% 97% 95%  Weight:    63.7 kg (140 lb 6.4 oz)  Height:        Intake/Output Summary (Last 24 hours) at 12/20/16 0934 Last data filed at 12/20/16 5329  Gross per 24 hour  Intake              680 ml  Output             1725 ml  Net            -1045 ml   Filed Weights   12/17/16 2348 12/19/16 0617 12/20/16 0742  Weight: 65.9 kg  (145 lb 4.8 oz) 66.7 kg (147 lb 1.6 oz) 63.7 kg (140 lb 6.4 oz)    Examination:  General exam: resting, NAD Respiratory system: no whezing Cardiovascular system: rrr Gastrointestinal system: +BS, soft Central nervous system: Alert- defers everything to daughter Moves all 4 ext    Data Reviewed: I have personally reviewed following labs and imaging studies  CBC:  Recent Labs Lab 12/17/16 1838 12/18/16 0742 12/19/16 0553  WBC 7.0 6.3 7.2  NEUTROABS 5.1  --   --   HGB 10.2* 8.9* 10.4*  HCT 31.8* 28.4* 33.0*  MCV 91.6 89.0 89.9  PLT 158 131* 924   Basic Metabolic Panel:  Recent Labs Lab 12/17/16 1838 12/19/16 0553  NA 134* 137  K 4.7 4.4  CL 95* 100*  CO2 28 25  GLUCOSE 263* 128*  BUN 24* 12  CREATININE 1.24 0.98  CALCIUM 9.3 9.5   GFR: Estimated Creatinine Clearance: 47.8 mL/min (by C-G formula based on SCr of 0.98 mg/dL). Liver Function Tests:  Recent Labs Lab 12/17/16 1838  AST 46*  ALT 40  ALKPHOS 97  BILITOT 0.5  PROT 7.7  ALBUMIN 3.5    Recent Labs Lab 12/17/16 1838  LIPASE 110*   No results for input(s): AMMONIA in the last 168 hours. Coagulation Profile: No results for input(s): INR, PROTIME in the last 168 hours. Cardiac Enzymes:  Recent Labs Lab 12/17/16 1838 12/18/16 0117 12/18/16 0742  TROPONINI 0.10* 0.09* 0.08*   BNP (last 3 results) No results for input(s): PROBNP in the last 8760 hours. HbA1C:  Recent Labs  12/18/16 0742  HGBA1C 8.5*   CBG:  Recent Labs Lab 12/19/16 0738 12/19/16 1120 12/19/16 1556 12/19/16 2301 12/20/16 0744  GLUCAP 140* 297* 338* 194* 220*   Lipid Profile: No results for input(s): CHOL, HDL, LDLCALC, TRIG, CHOLHDL, LDLDIRECT in the last 72 hours. Thyroid Function Tests: No results for input(s): TSH, T4TOTAL, FREET4, T3FREE, THYROIDAB in the last 72 hours. Anemia Panel: No results for input(s): VITAMINB12, FOLATE, FERRITIN, TIBC, IRON, RETICCTPCT in the last 72 hours. Urine analysis:     Component Value Date/Time   COLORURINE YELLOW 12/18/2016 [place] 12/18/2016 0759   LABSPEC 1.015 12/18/2016 0759   PHURINE 5.5 12/18/2016 0759   GLUCOSEU 500 (A) 12/18/2016 0759   HGBUR NEGATIVE 12/18/2016 0759   BILIRUBINUR NEGATIVE 12/18/2016 0759   KETONESUR NEGATIVE 12/18/2016 0759   PROTEINUR NEGATIVE 12/18/2016 0759   NITRITE NEGATIVE 12/18/2016 0759   LEUKOCYTESUR NEGATIVE 12/18/2016 0759     )No results found for this or any previous visit (from the past 240 hour(s)).    Anti-infectives    Start     Dose/Rate Route Frequency Ordered Stop  12/18/16 0130  azithromycin (ZITHROMAX) tablet 500 mg     500 mg Oral Daily 12/18/16 0104 12/25/16 0959   12/18/16 0115  cefTRIAXone (ROCEPHIN) 1 g in dextrose 5 % 50 mL IVPB     1 g 100 mL/hr over 30 Minutes Intravenous Every 24 hours 12/18/16 0104 12/25/16 0059       Radiology Studies: Ct Angio Chest Pe W Or Wo Contrast  Result Date: 12/18/2016 CLINICAL DATA:  Shortness of breath and weakness. Possible lung metastases. Cholangiocarcinoma. EXAM: CT ANGIOGRAPHY CHEST WITH CONTRAST TECHNIQUE: Multidetector CT imaging of the chest was performed using the standard protocol during bolus administration of intravenous contrast. Multiplanar CT image reconstructions and MIPs were obtained to evaluate the vascular anatomy. CONTRAST:  80 cc Isovue 370. COMPARISON:  Chest radiograph 12/17/2016 and CT abdomen pelvis 12/17/2016. FINDINGS: Cardiovascular: Image quality at the lung bases is degraded by respiratory motion. No definite pulmonary embolus. Atherosclerotic calcification of the arterial vasculature. Pulmonary arteries are borderline enlarged. Heart size within normal limits. No pericardial effusion. Mediastinum/Nodes: Mediastinal and hilar lymph nodes are not enlarged by CT size criteria. No axillary adenopathy. Esophagus is grossly unremarkable. Lungs/Pleura: Moderate centrilobular emphysema. Ill-defined areas of nodularity  and nodular airspace consolidation are seen peripherally in the lungs bilaterally and appear largely peribronchovascular in distribution, rather than hematogenous in distribution. Nodularity is seen along the fissures. Additionally, there is somewhat of an upper and mid lung zone predominance. Septal thickening is not an overwhelming feature. No pleural fluid. Airway is unremarkable. Upper Abdomen: Pneumobilia with a partially imaged wall stent in the common bile duct. Adrenal glands are unremarkable. Persistent excretion of contrast from the kidneys from yesterday's exam. Low-attenuation lesion off the upper pole left kidney measures 2.5 cm and is likely a cyst. Visualized portions of the spleen, pancreas, stomach and bowel are grossly unremarkable. No upper abdominal adenopathy. Musculoskeletal: Degenerative changes in the spine. No worrisome lytic or sclerotic lesions. Review of the MIP images confirms the above findings. IMPRESSION: 1. Image quality at the lung bases is degraded by respiratory motion. Otherwise, no pulmonary embolus. 2. Perilymphatic distribution of pulmonary nodules and nodular consolidation. Differential diagnosis includes lymphangitic carcinomatosis in this patient with a reported history of cholangiocarcinoma. Sarcoid can also have this appearance. Atypical infection is considered less likely. 3.  Aortic atherosclerosis (ICD10-170.0). 4.  Emphysema (ICD10-J43.9). Electronically Signed   By: Lorin Picket M.D.   On: 12/18/2016 16:15        Scheduled Meds: . aspirin EC  81 mg Oral Daily  . atorvastatin  10 mg Oral QHS  . azithromycin  500 mg Oral Daily  . bicalutamide  50 mg Oral Daily  . enoxaparin (LOVENOX) injection  40 mg Subcutaneous Q24H  . feeding supplement (ENSURE ENLIVE)  237 mL Oral BID BM  . gabapentin  100 mg Oral QHS  . insulin aspart  0-5 Units Subcutaneous QHS  . insulin aspart  0-9 Units Subcutaneous TID WC  . metoprolol tartrate  100 mg Oral Daily  .  pneumococcal 23 valent vaccine  0.5 mL Intramuscular Tomorrow-1000   Continuous Infusions: . cefTRIAXone (ROCEPHIN)  IV Stopped (12/20/16 0038)     LOS: 2 days    Time spent: 25 min    [place], DO [facility] Pager [phone]  If 7PM-7AM, please contact night-coverage [email or web address] Password [facility] 12/20/2016, 9:34 AM

## 2016-12-21 LAB — GLUCOSE, CAPILLARY
GLUCOSE-CAPILLARY: 471 mg/dL — AB (ref 65–99)
GLUCOSE-CAPILLARY: 514 mg/dL — AB (ref 65–99)
Glucose-Capillary: 235 mg/dL — ABNORMAL HIGH (ref 65–99)

## 2016-12-21 MED ORDER — INSULIN ASPART 100 UNIT/ML ~~LOC~~ SOLN: 0.0000 [IU] | Freq: Three times a day (TID) | SUBCUTANEOUS | 11 refills | Status: DC

## 2016-12-21 MED ORDER — ENSURE ENLIVE PO LIQD: 237.0000 mL | Freq: Two times a day (BID) | ORAL | 12 refills | Status: DC

## 2016-12-21 MED ORDER — INSULIN ASPART 100 UNIT/ML ~~LOC~~ SOLN: 0.0000 [IU] | Freq: Every day | SUBCUTANEOUS | 11 refills | Status: DC

## 2016-12-21 NOTE — Progress Notes (Signed)
Patient CBG 514 and recheck 471.  Dr. Eliseo Squires made aware.  Will continue to monitor.

## 2016-12-21 NOTE — Progress Notes (Signed)
Report given to [facility]. 

## 2016-12-21 NOTE — Clinical Social Work Note (Signed)
CSW facilitated patient discharge including contacting patient family and facility to confirm patient discharge plans. Clinical information faxed to facility and family agreeable with plan. CSW arranged ambulance transport via [facility] to [facility]. RN to call report prior to discharge ([phone]).  CSW will sign off for now as social work intervention is no longer needed. Please consult us again if new needs arise.  Maud Rubendall, CSW [phone]  

## 2016-12-21 NOTE — Discharge Summary (Addendum)
Physician Discharge Summary  [name] INO:[id number] DOB: 1926/12/14 DOA: 12/17/2016  PCP: System, Pcp Not In  Admit date: 12/17/2016 Discharge date: 12/21/2016   Recommendations for Outpatient Follow-Up:   1. To SNF with palliative services; on discharge, home with hospice services if still desired 2. Monitor blood sugars 3. Discussed with Dr. Nelda Marseille-- may do trial of chemotherapy-- he will set up outpatient follow up for 1-2 weeks   Discharge Diagnosis:   Principal Problem:   Weakness Active Problems:   CKD (chronic kidney disease), stage III   Cholangiocarcinoma (HCC)   Prostate cancer ([place])   Normocytic anemia   Elevated troponin   Type 2 diabetes mellitus with hyperosmolarity without coma, without long-term current use of insulin (HCC)   Essential hypertension   Coronary artery disease due to lipid rich plaque   Lung nodules   Protein-calorie malnutrition, severe   Discharge disposition:  Home.:  Discharge Condition: Improved.  Diet recommendation: Low sodium, heart healthy.  Carbohydrate-modified  Wound care: None.   History of Present Illness:   Willie Bruce is a 83 y.o. male with a past medical history significant for HTN, NIDDM, anemia, prostate cancer and cholangiocarcinoma who presents with 1 week slowly progressive weakness.  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  Saw Oncology in [facility] where he lived, was offered radiation or chemo for local control, but did not consult with a surgeon for definitive therapy, because it was felt he was not a surgical candidate for the necessary surgery.  It appears he was mulling over his decisions from Feb to May, when he saw Onc again and told them he had decided to move to [place] to pursue therapy down here.  To me, the patient states he has not yet established with any doctors down here (has only been here two  weeks) but that he hopes to do radiation here.  Of note, right before moving down here, he states he got weak, went to the hospital, was told he had "internal bleeding", had an endoscopy he thinks, and was transfused.  Since then, he has moved to [facility] and is living with a daughter.  He was able to ambulate and function until this weekend, he started to be extremely weak again, needed help just walking, getting out of a chair.  Per report he also appeared dyspneic and was intermittently confused, so family brought him in.  At no point did he have fever, chills.  At no point did he have cough, sputum production.  At no point did he have new abdominal pain, vomiting, diarrhea, nausea.  At no point did he have chest pain, diaphoresis or nausea, just that he was slowly progressively more fatigued with exertion over the weekend.  He was seen at [facility] that did a CXR showing patching nodular opacities in the lungs and was sent to [facility] for pneumonia.   Hospital Course by Problem:   Cancer of gallbladder: With local lymph node spread as well as lung involvement seen on most recent CTA of chest -spoke with Dr. Kate Sable-- rad onc at [facility] and no role for radiation at this time. -family asked for 2nd opinion- appreciate Dr. Ernestina Penna consult--- recommend palliative care -palliative care saw and current plan to go to SNF with palliative services and transition to hospice upon d/c  Weakness: -? progression of his cancer and FTT.  -PT eval- patient will need SNF with palliative to follow -  dc abx as nodes are most likely from cancer  Protein calorie malnutrition Supplements   Elevated troponin: Stable -echo reviewed -cardiology consult appreciated-- no need for further work up  Anemia: -periodic transfusion for anemia  Hypertension and CV disease secondary prevention: -Continue metoprolol, statin, aspirin  NIDDM: -Hold orals -SSI with meals  Prostate cancer: -Continue  Casodex     Medical Consultants:    Oncology  Palliative care   Discharge Exam:   Vitals:   12/20/16 2134 12/21/16 0539  BP: (!) 118/57 (!) 122/55  Pulse: 69 77  Resp:  16  Temp:  98.5 F (36.9 C)   Vitals:   12/20/16 0742 12/20/16 1143 12/20/16 2134 12/21/16 0539  BP: 136/64 (!) 122/50 (!) 118/57 (!) 122/55  Pulse: 78 63 69 77  Resp: 17 18  16   Temp: 98 F (36.7 C) 98.2 F (36.8 C)  98.5 F (36.9 C)  TempSrc: Oral Oral  Oral  SpO2: 95% 96% 95% 96%  Weight: 63.7 kg (140 lb 6.4 oz)   63.2 kg (139 lb 6.4 oz)  Height:        Gen:  NAD- eating breakfast    The results of significant diagnostics from this hospitalization (including imaging, microbiology, ancillary and laboratory) are listed below for reference.     Procedures and Diagnostic Studies:   Dg Chest 2 View  Result Date: 12/17/2016 CLINICAL DATA:  Exertional shortness breath for several days. EXAM: CHEST  2 VIEW COMPARISON:  None. FINDINGS: Normal sized heart. Post CABG changes. Mild patchy opacities scattered throughout the right lung.a mall poorly defined nodular opacity in the left mid lung zone. Unremarkable bones. IMPRESSION: Mild patchy opacities in the right lung and small poorly defined nodular opacity in the left mid lung zone. These could represent infectious or post infectious changes. A chest CT with contrast may provide useful additional information. Electronically Signed   By: Claudie Revering M.D.   On: 12/17/2016 21:41   Ct Angio Chest Pe W Or Wo Contrast  Result Date: 12/18/2016 CLINICAL DATA:  Shortness of breath and weakness. Possible lung metastases. Cholangiocarcinoma. EXAM: CT ANGIOGRAPHY CHEST WITH CONTRAST TECHNIQUE: Multidetector CT imaging of the chest was performed using the standard protocol during bolus administration of intravenous contrast. Multiplanar CT image reconstructions and MIPs were obtained to evaluate the vascular anatomy. CONTRAST:  80 cc Isovue 370. COMPARISON:  Chest  radiograph 12/17/2016 and CT abdomen pelvis 12/17/2016. FINDINGS: Cardiovascular: Image quality at the lung bases is degraded by respiratory motion. No definite pulmonary embolus. Atherosclerotic calcification of the arterial vasculature. Pulmonary arteries are borderline enlarged. Heart size within normal limits. No pericardial effusion. Mediastinum/Nodes: Mediastinal and hilar lymph nodes are not enlarged by CT size criteria. No axillary adenopathy. Esophagus is grossly unremarkable. Lungs/Pleura: Moderate centrilobular emphysema. Ill-defined areas of nodularity and nodular airspace consolidation are seen peripherally in the lungs bilaterally and appear largely peribronchovascular in distribution, rather than hematogenous in distribution. Nodularity is seen along the fissures. Additionally, there is somewhat of an upper and mid lung zone predominance. Septal thickening is not an overwhelming feature. No pleural fluid. Airway is unremarkable. Upper Abdomen: Pneumobilia with a partially imaged wall stent in the common bile duct. Adrenal glands are unremarkable. Persistent excretion of contrast from the kidneys from yesterday's exam. Low-attenuation lesion off the upper pole left kidney measures 2.5 cm and is likely a cyst. Visualized portions of the spleen, pancreas, stomach and bowel are grossly unremarkable. No upper abdominal adenopathy. Musculoskeletal: Degenerative changes in the spine. No  worrisome lytic or sclerotic lesions. Review of the MIP images confirms the above findings. IMPRESSION: 1. Image quality at the lung bases is degraded by respiratory motion. Otherwise, no pulmonary embolus. 2. Perilymphatic distribution of pulmonary nodules and nodular consolidation. Differential diagnosis includes lymphangitic carcinomatosis in this patient with a reported history of cholangiocarcinoma. Sarcoid can also have this appearance. Atypical infection is considered less likely. 3.  Aortic atherosclerosis  (ICD10-170.0). 4.  Emphysema (ICD10-J43.9). Electronically Signed   By: Lorin Picket M.D.   On: 12/18/2016 16:15   Ct Abdomen Pelvis W Contrast  Result Date: 12/17/2016 CLINICAL DATA:  Exertional shortness of breath, history of recently diagnosed cholangiocarcinoma EXAM: CT ABDOMEN AND PELVIS WITH CONTRAST TECHNIQUE: Multidetector CT imaging of the abdomen and pelvis was performed using the standard protocol following bolus administration of intravenous contrast. CONTRAST:  171mL ISOVUE-300 IOPAMIDOL (ISOVUE-300) INJECTION 61% COMPARISON:  None. FINDINGS: Lower chest: Lung bases demonstrate multiple, greater than 6, peripherally based nodules within the right lower lobe, right middle lobe, lingula and left lower lobe. No pleural effusion or focal consolidation is seen. The heart is nonenlarged. Coronary artery calcifications. Hepatobiliary: Vague hypodense somewhat branching appearing structures in the right hepatic lobe. No focal mass is seen. Pneumobilia a with presence of biliary stent. Fluid or debris in the distal portion of the stent at the duodenum. Small air in the gallbladder. No calcified stones. Ill-defined soft tissue thickening/hypodensity at the distal portion of biliary stent Pancreas: Pancreatic duct is enlarged. Slight bloody appearance at the head of pancreas and second portion of duodenum Spleen: Multiple hypodense subcentimeter splenic lesions. Adrenals/Urinary Tract: Adrenal glands are within normal limits. The kidneys show no hydronephrosis. Cyst upper pole left kidney measuring 2.5 cm. Possible stones versus cortical calcifications in the lower pole of the left kidney. 15 mm cyst lower pole of the right kidney. Parapelvic cysts in the upper pole of the right kidney. Bladder unremarkable. Stomach/Bowel: Stomach is nonenlarged. No dilated small bowel. No colon wall thickening. Normal appendix. Vascular/Lymphatic: Extensive atherosclerotic calcifications of the aorta. Aneurysmal dilatation  of the right common iliac artery up to 2.9 cm. 16 mm peripancreatic lymph node. No significantly enlarged pelvic nodes. Reproductive: Enlarged prostate with mass effect on the posterior bladder. Metallic densities at the prostate gland. Other: No free air or free fluid.  Small fat in the umbilicus. Musculoskeletal: 11 mm anterolisthesis of L4 on L5 with fusion of the vertebral bodies. Bilateral pars defect at L4. Retrolisthesis of L5 on S1 with degenerative changes. No suspicious bone lesion. IMPRESSION: 1. Multiple subpleural bilateral lower lobe, right middle lobe and lingular pulmonary nodules suspicious for respiratory infection or atypical pneumonia. Metastatic nodules also possible given history. Non-contrast chest CT at 3-6 months is recommended. If the nodules are stable at time of repeat CT, then future CT at 18-24 months (from today's scan) is considered optional for low-risk patients, but is recommended for high-risk patients. This recommendation follows the consensus statement: Guidelines for Management of Incidental Pulmonary Nodules Detected on CT Images: From the Fleischner Society 2017; Radiology 2017; 284:228-243. 2. Pneumobilia with presence of biliary stent. Small amount of debris or tissue in the distal portion of the stent. Ill-defined hypodensity around the distal stent/head of pancreas, which may relate to history of cholangiocarcinoma. Pancreatic duct is enlarged. 3. Vague peripheral possibly branching hypodensities mostly in the right hepatic lobe, could relate to distal ductal dilatation. 4. Subcentimeter splenic lesions too small to further characterize. 5. Aneurysmal dilatation of the iliac arteries, on the right up  to 2.9 cm and on the left up to 1.9 cm. 6. Enlarged heterogenous prostate gland with mass effect on the posterior bladder. 7. 11 mm anterolisthesis of L4 on L5 with bilateral pars defect at L4 Electronically Signed   By: Donavan Foil M.D.   On: 12/17/2016 22:14     Labs:    Basic Metabolic Panel:  Recent Labs Lab 12/17/16 1838 12/19/16 0553  NA 134* 137  K 4.7 4.4  CL 95* 100*  CO2 28 25  GLUCOSE 263* 128*  BUN 24* 12  CREATININE 1.24 0.98  CALCIUM 9.3 9.5   GFR Estimated Creatinine Clearance: 46.6 mL/min (by C-G formula based on SCr of 0.98 mg/dL). Liver Function Tests:  Recent Labs Lab 12/17/16 1838  AST 46*  ALT 40  ALKPHOS 97  BILITOT 0.5  PROT 7.7  ALBUMIN 3.5    Recent Labs Lab 12/17/16 1838  LIPASE 110*   No results for input(s): AMMONIA in the last 168 hours. Coagulation profile No results for input(s): INR, PROTIME in the last 168 hours.  CBC:  Recent Labs Lab 12/17/16 1838 12/18/16 0742 12/19/16 0553  WBC 7.0 6.3 7.2  NEUTROABS 5.1  --   --   HGB 10.2* 8.9* 10.4*  HCT 31.8* 28.4* 33.0*  MCV 91.6 89.0 89.9  PLT 158 131* 175   Cardiac Enzymes:  Recent Labs Lab 12/17/16 1838 12/18/16 0117 12/18/16 0742  TROPONINI 0.10* 0.09* 0.08*   BNP: Invalid input(s): POCBNP CBG:  Recent Labs Lab 12/20/16 0744 12/20/16 1128 12/20/16 1637 12/20/16 2134 12/21/16 0748  GLUCAP 220* 371* 197* 273* 235*   D-Dimer No results for input(s): DDIMER in the last 72 hours. Hgb A1c No results for input(s): HGBA1C in the last 72 hours. Lipid Profile No results for input(s): CHOL, HDL, LDLCALC, TRIG, CHOLHDL, LDLDIRECT in the last 72 hours. Thyroid function studies No results for input(s): TSH, T4TOTAL, T3FREE, THYROIDAB in the last 72 hours.  Invalid input(s): FREET3 Anemia work up No results for input(s): VITAMINB12, FOLATE, FERRITIN, TIBC, IRON, RETICCTPCT in the last 72 hours. Microbiology No results found for this or any previous visit (from the past 240 hour(s)).   Discharge Instructions:   Discharge Instructions    Diet - low sodium heart healthy    Complete by:  As directed    Diet Carb Modified    Complete by:  As directed    Increase activity slowly    Complete by:  As directed      Allergies  as of 12/21/2016   No Known Allergies     Medication List    STOP taking these medications   leuprolide 30 MG injection Commonly known as:  LUPRON     TAKE these medications   aspirin EC 81 MG tablet Take 81 mg by mouth daily.   atorvastatin 10 MG tablet Commonly known as:  LIPITOR Take 10 mg by mouth at bedtime.   bicalutamide 50 MG tablet Commonly known as:  CASODEX Take 50 mg by mouth daily.   feeding supplement (ENSURE ENLIVE) Liqd Take 237 mLs by mouth 2 (two) times daily between meals.   FERROUS SULFATE PO Take 1 tablet by mouth daily.   gabapentin 100 MG capsule Commonly known as:  NEURONTIN Take 100 mg by mouth at bedtime.   insulin aspart 100 UNIT/ML injection Commonly known as:  novoLOG Inject 0-5 Units into the skin at bedtime.   insulin aspart 100 UNIT/ML injection Commonly known as:  novoLOG Inject 0-9 Units into  the skin 3 (three) times daily with meals.   meclizine 25 MG tablet Commonly known as:  ANTIVERT Take 25 mg by mouth 3 (three) times daily as needed for dizziness.   meloxicam 7.5 MG tablet Commonly known as:  MOBIC Take 7.5 mg by mouth daily.   metoprolol tartrate 100 MG tablet Commonly known as:  LOPRESSOR Take 100 mg by mouth daily.   repaglinide 1 MG tablet Commonly known as:  PRANDIN Take 2 mg by mouth 3 (three) times daily before meals.   tamsulosin 0.4 MG Caps capsule Commonly known as:  FLOMAX Take 0.4 mg by mouth daily.       Contact information for follow-up providers    Dr. Nelda Marseille at [facility] oncology Follow up.            Contact information for after-discharge care    Destination    [facility] Follow up.   Specialty:  [place] information: [address] [place] [phone]                   Time coordinating discharge: 35 min  Signed:  [place] Alison Stalling   [facility] 12/21/2016, 8:23 AM

## 2016-12-21 NOTE — Clinical Social Work Note (Addendum)
Patient's daughter emailed CSW to notify that she has chosen [facility] due to proximity to their home. [facility] has extended a bed offer. CSW left message for admissions coordinator to make her aware.  Dayton Scrape, [place] [phone]  10:13 am [facility] able to take patient today. CSW left voicemail for daughter. Will arrange transport after she calls back.  Dayton Scrape, CSW [phone]  11:49 am CSW spoke with patient's daughter. She will call admissions coordinator to set up a time for paperwork. CSW will wait for clearance from facility to send patient by [facility].  Dayton Scrape, [place]

## 2016-12-21 NOTE — Clinical Social Work Placement (Signed)
   CLINICAL SOCIAL WORK PLACEMENT  NOTE  Date:  12/21/2016  Patient Details  Name: Willie Bruce MRN: [id number] Date of Birth: Dec 28, 1926  Clinical Social Work is seeking post-discharge placement for this patient at the [place] level of care (*CSW will initial, date and re-position this form in  chart as items are completed):  Yes   Patient/family provided with [place] Work Department's list of facilities offering this level of care within the geographic area requested by the patient (or if unable, by the patient's family).  Yes   Patient/family informed of their freedom to choose among providers that offer the needed level of care, that participate in Medicare, Medicaid or managed care program needed by the patient, have an available bed and are willing to accept the patient.  Yes   Patient/family informed of [facility]'s ownership interest in [facility] and [facility], as well as of the fact that they are under no obligation to receive care at these facilities.  PASRR submitted to EDS on 12/20/16     PASRR number received on 12/20/16     Existing PASRR number confirmed on       FL2 transmitted to all facilities in geographic area requested by pt/family on 12/20/16     FL2 transmitted to all facilities within larger geographic area on       Patient informed that his/her managed care company has contracts with or will negotiate with certain facilities, including the following:        Yes   Patient/family informed of bed offers received.  Patient chooses bed at [facility]     Physician recommends and patient chooses bed at      Patient to be transferred to [facility] on 12/21/16.  Patient to be transferred to facility by [facility]     Patient family notified on 12/21/16 of transfer.  Name of family member notified:  [name]     PHYSICIAN Please prepare prescriptions     Additional Comment:     _______________________________________________ Candie Chroman, LCSW 12/21/2016, 12:22 PM

## 2016-12-22 ENCOUNTER — Encounter: Payer: Self-pay | Admitting: Internal Medicine

## 2016-12-22 ENCOUNTER — Non-Acute Institutional Stay (SKILLED_NURSING_FACILITY): Payer: Medicare Other | Admitting: Internal Medicine

## 2016-12-22 DIAGNOSIS — E43 Unspecified severe protein-calorie malnutrition: Secondary | ICD-10-CM | POA: Diagnosis not present

## 2016-12-22 DIAGNOSIS — C221 Intrahepatic bile duct carcinoma: Secondary | ICD-10-CM | POA: Diagnosis not present

## 2016-12-22 DIAGNOSIS — R918 Other nonspecific abnormal finding of lung field: Secondary | ICD-10-CM | POA: Diagnosis not present

## 2016-12-22 DIAGNOSIS — C61 Malignant neoplasm of prostate: Secondary | ICD-10-CM

## 2016-12-22 DIAGNOSIS — I2583 Coronary atherosclerosis due to lipid rich plaque: Secondary | ICD-10-CM

## 2016-12-22 DIAGNOSIS — D649 Anemia, unspecified: Secondary | ICD-10-CM

## 2016-12-22 DIAGNOSIS — I2510 Atherosclerotic heart disease of native coronary artery without angina pectoris: Secondary | ICD-10-CM

## 2016-12-22 DIAGNOSIS — E1100 Type 2 diabetes mellitus with hyperosmolarity without nonketotic hyperglycemic-hyperosmolar coma (NKHHC): Secondary | ICD-10-CM

## 2016-12-22 DIAGNOSIS — I10 Essential (primary) hypertension: Secondary | ICD-10-CM | POA: Diagnosis not present

## 2016-12-22 NOTE — Progress Notes (Signed)
: Provider:  Noah Delaine. Sheppard Coil, MD Location:  [facility] Room Number: 094B Place of Service:  SNF (31)  PCP: System, Pcp Not In Patient Care Team: System, Pcp Not In as PCP - General  Extended Emergency Contact Information Primary Emergency Contact: [facility] Address: [address]          [place], [place] [address] [place] Phone: [phone] Relation: Daughter     Allergies: Patient has no known allergies.  Chief Complaint  Patient presents with  . New Admit To SNF    Admit to Facility    HPI: Patient is 83 y.o. male with HTN, NIDDM, anemia, prostate cancer and cholangiocarcinomawho presents with 1 week slowly progressive weakness. 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 Saw Oncology in [facility] where he lived, was offered radiation or chemo for local control, but did not consult with a surgeon for definitive therapy, because it was felt he was not a surgical candidate for the necessary surgery. It appears he was mulling over his decisions from Feb to May, when he saw Onc again and told them he had decided to move to [place] to pursue therapy down here. To me, the patient states he has not yet established with any doctors down here (has only been here two weeks) but that he hopes to do radiation here. Of note, right before moving down here, he states he got weak, went to the hospital, was told he had "internal bleeding", had an endoscopy he thinks, and was transfused .Since then, he has moved to [facility] and is living with a daughter. He was able to ambulate and function until this weekend, he started to be extremely weak again, needed help just walking, getting out of a chair. Per report he also appeared dyspneic and was intermittently confused, so family brought him in. At no point did he have fever, chills. At no point did he have  cough, sputum production. At no point did he have new abdominal pain, vomiting, diarrhea, nausea. At no point did he have chest pain, diaphoresis or nausea, just that he was slowly progressively more fatigued with exertion over the weekend. He was seen at [facility] that did a CXR showing patching nodular opacities in the lungs and was sent to [facility] where pt was admitted from 6/10-14 for PNA pneumonia which pt did not have. He has weakness from metastatic cancer of GB and lymph nodes are from CA, not PNA. Pt was seen by palliative care in hospital. Pt is admitted to SNF for generalized wealkness. While at SNF pt will be followed for HTN, tx with metoprolol, , prostate CA, tx with casodex, and DM2, tx with SSI and prandin.   Past Medical History:  Diagnosis Date  . Cancer ([place])   . Coronary artery disease   . Diabetes mellitus without complication ([place])   . High cholesterol   . Hypertension     Past Surgical History:  Procedure Laterality Date  . CORONARY ARTERY BYPASS GRAFT      Allergies as of 12/22/2016   No Known Allergies     Medication List       Accurate as of 12/22/16  9:11 AM. Always use your most recent med list.          aspirin EC 81 MG tablet Take 81 mg by mouth daily.   atorvastatin 10 MG tablet Commonly known  as:  LIPITOR Take 10 mg by mouth at bedtime.   bicalutamide 50 MG tablet Commonly known as:  CASODEX Take 50 mg by mouth daily.   feeding supplement (ENSURE ENLIVE) Liqd Take 237 mLs by mouth 2 (two) times daily between meals.   ferrous sulfate 325 (65 FE) MG tablet Take 325 mg by mouth daily with breakfast.   gabapentin 100 MG capsule Commonly known as:  NEURONTIN Take 100 mg by mouth at bedtime.   insulin aspart 100 UNIT/ML injection Commonly known as:  novoLOG Inject 0-5 Units into the skin at bedtime.   insulin aspart 100 UNIT/ML injection Commonly known as:  novoLOG Inject 0-9 Units into the skin 3 (three) times daily with meals.   meclizine  25 MG tablet Commonly known as:  ANTIVERT Take 25 mg by mouth 3 (three) times daily as needed for dizziness.   meloxicam 7.5 MG tablet Commonly known as:  MOBIC Take 7.5 mg by mouth daily.   metoprolol tartrate 100 MG tablet Commonly known as:  LOPRESSOR Take 100 mg by mouth daily.   repaglinide 1 MG tablet Commonly known as:  PRANDIN Take 2 mg by mouth 3 (three) times daily before meals.   tamsulosin 0.4 MG Caps capsule Commonly known as:  FLOMAX Take 0.4 mg by mouth daily.       Meds ordered this encounter  Medications  . ferrous sulfate 325 (65 FE) MG tablet    Sig: Take 325 mg by mouth daily with breakfast.    There is no immunization history for the selected administration types on file for this patient.  Social History  Substance Use Topics  . Smoking status: Never Smoker  . Smokeless tobacco: Never Used  . Alcohol use Not on file    Family history is   Family History  Problem Relation Age of Onset  . Lung cancer Sister       Review of Systems  DATA OBTAINED: from patient GENERAL:  no fevers, fatigue, appetite changes SKIN: No itching, or rash EYES: No eye pain, redness, discharge EARS: No earache, tinnitus, change in hearing NOSE: No congestion, drainage or bleeding  MOUTH/THROAT: No mouth or tooth pain, No sore throat RESPIRATORY: No cough, wheezing, SOB CARDIAC: No chest pain, palpitations, lower extremity edema  GI: No abdominal pain, No N/V/D or constipation, No heartburn or reflux  GU: No dysuria, frequency or urgency, or incontinence  MUSCULOSKELETAL: No unrelieved bone/joint pain NEUROLOGIC: No headache, dizziness or focal weakness PSYCHIATRIC: No c/o anxiety or sadness   Vitals:   12/22/16 0855  BP: 109/71  Pulse: 81  Resp: 20  Temp: 97.6 F (36.4 C)    SpO2 Readings from Last 1 Encounters:  12/22/16 96%   Body mass index is 19.59 kg/m.     Physical Exam  GENERAL APPEARANCE: Alert, conversant,  No acute distress, thin BM   SKIN: No diaphoresis rash HEAD: Normocephalic, atraumatic  EYES: Conjunctiva/lids clear. Pupils round, reactive. EOMs intact.  EARS: External exam WNL, canals clear. Hearing grossly normal.  NOSE: No deformity or discharge.  MOUTH/THROAT: Lips w/o lesions  RESPIRATORY: Breathing is even, unlabored. Lung sounds are clear   CARDIOVASCULAR: Heart RRR no murmurs, rubs or gallops. No peripheral edema.   GASTROINTESTINAL: Abdomen is soft, non-tender, not distended w/ normal bowel sounds. GENITOURINARY: Bladder non tender, not distended  MUSCULOSKELETAL: No abnormal joints or musculature NEUROLOGIC:  Cranial nerves 2-12 grossly intact. Moves all extremities  PSYCHIATRIC: Mood and affect appropriate to situation, no behavioral issues  Patient Active Problem List   Diagnosis Date Noted  . Lung nodules 12/19/2016  . Protein-calorie malnutrition, severe 12/19/2016  . Atypical pneumonia 12/18/2016  . CKD (chronic kidney disease), stage III 12/18/2016  . Normocytic anemia 12/18/2016  . Elevated troponin 12/18/2016  . Type 2 diabetes mellitus with hyperosmolarity without coma, without long-term current use of insulin ([facility]) 12/18/2016  . Essential hypertension 12/18/2016  . Coronary artery disease due to lipid rich plaque 12/18/2016  . Weakness 12/17/2016  . Cholangiocarcinoma ([place]) 09/07/2016  . Prostate cancer ([place]) 09/06/2016      Labs reviewed: Basic Metabolic Panel:    Component Value Date/Time   NA 137 12/19/2016 0553   NA 137 12/19/2016   K 4.4 12/19/2016 0553   CL 100 (L) 12/19/2016 0553   CO2 25 12/19/2016 0553   GLUCOSE 128 (H) 12/19/2016 0553   BUN 12 12/19/2016 0553   BUN 12 12/19/2016   CREATININE 0.98 12/19/2016 0553   CALCIUM 9.5 12/19/2016 0553   PROT 7.7 12/17/2016 1838   ALBUMIN 3.5 12/17/2016 1838   AST 46 (H) 12/17/2016 1838   ALT 40 12/17/2016 1838   ALKPHOS 97 12/17/2016 1838   BILITOT 0.5 12/17/2016 1838   GFRNONAA >60 12/19/2016 0553   GFRAA >60  12/19/2016 0553     Recent Labs  12/17/16 12/17/16 1838 12/19/16 12/19/16 0553  NA 134* 134* 137 137  K 4.7 4.7  --  4.4  CL  --  95*  --  100*  CO2  --  28  --  25  GLUCOSE  --  263*  --  128*  BUN 24* 24* 12 12  CREATININE 1.2 1.24 1.0 0.98  CALCIUM  --  9.3  --  9.5   Liver Function Tests:  Recent Labs  12/17/16 1838  AST 46*  ALT 40  ALKPHOS 97  BILITOT 0.5  PROT 7.7  ALBUMIN 3.5    Recent Labs  12/17/16 1838  LIPASE 110*   No results for input(s): AMMONIA in the last 8760 hours. CBC:  Recent Labs  12/17/16 1838 12/18/16 12/18/16 0742 12/19/16 12/19/16 0553  WBC 7.0 6.3 6.3 7.2 7.2  NEUTROABS 5.1  --   --   --   --   HGB 10.2* 8.9* 8.9*  --  10.4*  HCT 31.8* 28* 28.4*  --  33.0*  MCV 91.6  --  89.0  --  89.9  PLT 158 131* 131*  --  175   Lipid No results for input(s): CHOL, HDL, LDLCALC, TRIG in the last 8760 hours.  Cardiac Enzymes:  Recent Labs  12/17/16 1838 12/18/16 0117 12/18/16 0742  TROPONINI 0.10* 0.09* 0.08*   BNP:  Recent Labs  12/17/16 1838  BNP 87.3   No results found for: [facility] Lab Results  Component Value Date   HGBA1C 8.5 (H) 12/18/2016   No results found for: TSH No results found for: VITAMINB12 No results found for: FOLATE No results found for: IRON, TIBC, FERRITIN  Imaging and Procedures obtained prior to SNF admission: Dg Chest 2 View  Result Date: 12/17/2016 CLINICAL DATA:  Exertional shortness breath for several days. EXAM: CHEST  2 VIEW COMPARISON:  None. FINDINGS: Normal sized heart. Post CABG changes. Mild patchy opacities scattered throughout the right lung.a mall poorly defined nodular opacity in the left mid lung zone. Unremarkable bones. IMPRESSION: Mild patchy opacities in the right lung and small poorly defined nodular opacity in the left mid lung zone. These could represent infectious or post  infectious changes. A chest CT with contrast may provide useful additional information. Electronically  Signed   By: Claudie Revering M.D.   On: 12/17/2016 21:41   Ct Angio Chest Pe W Or Wo Contrast  Result Date: 12/18/2016 CLINICAL DATA:  Shortness of breath and weakness. Possible lung metastases. Cholangiocarcinoma. EXAM: CT ANGIOGRAPHY CHEST WITH CONTRAST TECHNIQUE: Multidetector CT imaging of the chest was performed using the standard protocol during bolus administration of intravenous contrast. Multiplanar CT image reconstructions and MIPs were obtained to evaluate the vascular anatomy. CONTRAST:  80 cc Isovue 370. COMPARISON:  Chest radiograph 12/17/2016 and CT abdomen pelvis 12/17/2016. FINDINGS: Cardiovascular: Image quality at the lung bases is degraded by respiratory motion. No definite pulmonary embolus. Atherosclerotic calcification of the arterial vasculature. Pulmonary arteries are borderline enlarged. Heart size within normal limits. No pericardial effusion. Mediastinum/Nodes: Mediastinal and hilar lymph nodes are not enlarged by CT size criteria. No axillary adenopathy. Esophagus is grossly unremarkable. Lungs/Pleura: Moderate centrilobular emphysema. Ill-defined areas of nodularity and nodular airspace consolidation are seen peripherally in the lungs bilaterally and appear largely peribronchovascular in distribution, rather than hematogenous in distribution. Nodularity is seen along the fissures. Additionally, there is somewhat of an upper and mid lung zone predominance. Septal thickening is not an overwhelming feature. No pleural fluid. Airway is unremarkable. Upper Abdomen: Pneumobilia with a partially imaged wall stent in the common bile duct. Adrenal glands are unremarkable. Persistent excretion of contrast from the kidneys from yesterday's exam. Low-attenuation lesion off the upper pole left kidney measures 2.5 cm and is likely a cyst. Visualized portions of the spleen, pancreas, stomach and bowel are grossly unremarkable. No upper abdominal adenopathy. Musculoskeletal: Degenerative changes in  the spine. No worrisome lytic or sclerotic lesions. Review of the MIP images confirms the above findings. IMPRESSION: 1. Image quality at the lung bases is degraded by respiratory motion. Otherwise, no pulmonary embolus. 2. Perilymphatic distribution of pulmonary nodules and nodular consolidation. Differential diagnosis includes lymphangitic carcinomatosis in this patient with a reported history of cholangiocarcinoma. Sarcoid can also have this appearance. Atypical infection is considered less likely. 3.  Aortic atherosclerosis (ICD10-170.0). 4.  Emphysema (ICD10-J43.9). Electronically Signed   By: Lorin Picket M.D.   On: 12/18/2016 16:15   Ct Abdomen Pelvis W Contrast  Result Date: 12/17/2016 CLINICAL DATA:  Exertional shortness of breath, history of recently diagnosed cholangiocarcinoma EXAM: CT ABDOMEN AND PELVIS WITH CONTRAST TECHNIQUE: Multidetector CT imaging of the abdomen and pelvis was performed using the standard protocol following bolus administration of intravenous contrast. CONTRAST:  1108mL ISOVUE-300 IOPAMIDOL (ISOVUE-300) INJECTION 61% COMPARISON:  None. FINDINGS: Lower chest: Lung bases demonstrate multiple, greater than 6, peripherally based nodules within the right lower lobe, right middle lobe, lingula and left lower lobe. No pleural effusion or focal consolidation is seen. The heart is nonenlarged. Coronary artery calcifications. Hepatobiliary: Vague hypodense somewhat branching appearing structures in the right hepatic lobe. No focal mass is seen. Pneumobilia a with presence of biliary stent. Fluid or debris in the distal portion of the stent at the duodenum. Small air in the gallbladder. No calcified stones. Ill-defined soft tissue thickening/hypodensity at the distal portion of biliary stent Pancreas: Pancreatic duct is enlarged. Slight bloody appearance at the head of pancreas and second portion of duodenum Spleen: Multiple hypodense subcentimeter splenic lesions. Adrenals/Urinary  Tract: Adrenal glands are within normal limits. The kidneys show no hydronephrosis. Cyst upper pole left kidney measuring 2.5 cm. Possible stones versus cortical calcifications in the lower pole of the left kidney. 15  mm cyst lower pole of the right kidney. Parapelvic cysts in the upper pole of the right kidney. Bladder unremarkable. Stomach/Bowel: Stomach is nonenlarged. No dilated small bowel. No colon wall thickening. Normal appendix. Vascular/Lymphatic: Extensive atherosclerotic calcifications of the aorta. 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 16 mm peripancreatic lymph node. No significantly enlarged pelvic nodes. Reproductive: Enlarged prostate with mass effect on the posterior bladder. Metallic densities at the prostate gland. Other: No free air or free fluid.  Small fat in the umbilicus. Musculoskeletal: 11 mm anterolisthesis of L4 on L5 with fusion of the vertebral bodies. Bilateral pars defect at L4. Retrolisthesis of L5 on S1 with degenerative changes. No suspicious bone lesion. IMPRESSION: 1. Multiple subpleural bilateral lower lobe, right middle lobe and lingular pulmonary nodules suspicious for respiratory infection or atypical pneumonia. Metastatic nodules also possible given history. Non-contrast chest CT at 3-6 months is recommended. If the nodules are stable at time of repeat CT, then future CT at 18-24 months (from today's scan) is considered optional for low-risk patients, but is recommended for high-risk patients. This recommendation follows the consensus statement: Guidelines for Management of Incidental Pulmonary Nodules Detected on CT Images: From the Fleischner Society 2017; Radiology 2017; 284:228-243. 2. Pneumobilia with presence of biliary stent. Small amount of debris or tissue in the distal portion of the stent. Ill-defined hypodensity around the distal stent/head of pancreas, which may relate to history of cholangiocarcinoma. Pancreatic duct is enlarged.  3. Vague peripheral possibly branching hypodensities mostly in the right hepatic lobe, could relate to distal ductal dilatation. 4. Subcentimeter splenic lesions too small to further characterize. 5. Aneurysmal dilatation of the iliac arteries, on the right up to 2.9 cm and on the left up to 1.9 cm. 6. Enlarged heterogenous prostate gland with mass effect on the posterior bladder. 7. 11 mm anterolisthesis of L4 on L5 with bilateral pars defect at L4 Electronically Signed   By: Donavan Foil M.D.   On: 12/17/2016 22:14     Not all labs, radiology exams or other studies done during hospitalization come through on my EPIC note; however they are reviewed by me.    Assessment and Plan  CANCER OF GB/ LUNG NODULES -local lymph node spread as well as lung involvement seen on most recent CTA of chestrad onc at [facility] and no role for radiation at this time. -family asked for 2nd opinion- Dr. Ernestina Penna consulted--- recommend palliative care -palliative care saw and current plan to go to SNF with palliative services and transition to hospice upon d/c SNF - will consult palliative, so pt can have OT/PT  FTT / PROTEIN CALORIE MALNUTRITION-- 2/2 metastatic CA SNF - encourage eating, he like shakes and will add prostat  ANEMIA - periodic tx for anemia SNF - will follow CBC; cont iron daily  HTN/ CAD SNF -BP controlled;cont metoprolol 100 mg daily; ASA and statin for CAD along with metoprolol  DM2 SNF - cont SSI and prandin 2mg  TID and SSI  PROSTATE CA SNF - cont casodex 50 mg daily and flomax 0.4 mg daily   Time spent > 45 min;> 50% of time with patient was spent reviewing records, labs, tests and studies, counseling and developing plan of care  Webb Silversmith D. Sheppard Coil, MD

## 2016-12-23 ENCOUNTER — Encounter: Payer: Self-pay | Admitting: Internal Medicine

## 2016-12-23 DIAGNOSIS — E46 Unspecified protein-calorie malnutrition: Secondary | ICD-10-CM | POA: Insufficient documentation

## 2016-12-25 ENCOUNTER — Encounter: Payer: Self-pay | Admitting: Internal Medicine

## 2016-12-25 ENCOUNTER — Non-Acute Institutional Stay (SKILLED_NURSING_FACILITY): Payer: Medicare Other | Admitting: Internal Medicine

## 2016-12-25 DIAGNOSIS — Z794 Long term (current) use of insulin: Secondary | ICD-10-CM | POA: Diagnosis not present

## 2016-12-25 DIAGNOSIS — C221 Intrahepatic bile duct carcinoma: Secondary | ICD-10-CM | POA: Diagnosis not present

## 2016-12-25 DIAGNOSIS — E1165 Type 2 diabetes mellitus with hyperglycemia: Secondary | ICD-10-CM

## 2016-12-25 LAB — BASIC METABOLIC PANEL
BUN: 20 (ref 4–21)
Creatinine: 0.9 (ref 0.6–1.3)
Glucose: 400
Potassium: 4.6 (ref 3.4–5.3)
SODIUM: 135 — AB (ref 137–147)

## 2016-12-25 LAB — CBC AND DIFFERENTIAL
HEMATOCRIT: 35 — AB (ref 41–53)
HEMOGLOBIN: 10.7 — AB (ref 13.5–17.5)
PLATELETS: 184 (ref 150–399)
WBC: 7.6

## 2016-12-25 NOTE — Progress Notes (Signed)
Location:  [facility] Room Number: 619J Place of Service:  SNF (31)  Willie Bruce. Willie Coil, MD  Patient Care Team: System, Pcp Not In as PCP - General  Extended Emergency Contact Information Primary Emergency Contact: [facility] Address: [address]          [place], [place] [address] [place] Phone: [phone] Relation: Daughter    Allergies: Patient has no known allergies.  Chief Complaint  Patient presents with  . Acute Visit    Acute    HPI: Patient is 83 y.o. male with metastatic GB cancer who is being seen or high blood sugars. Pt was tx at hospital with SSI only and was d/c with prandin and SSI coverage . Apparently pt's appetite has picked up a lot and it was reported pt BS was HHH over the WE and 12 u of humalog only brought BS down to 550. This morning pt's BS was HHH again. Pt is feeling fine. Pt will not allow an IV but is drinking well.  Past Medical History:  Diagnosis Date  . Cancer ([place])   . Coronary artery disease   . Diabetes mellitus without complication ([place])   . High cholesterol   . Hypertension     Past Surgical History:  Procedure Laterality Date  . CORONARY ARTERY BYPASS GRAFT      Allergies as of 12/25/2016   No Known Allergies     Medication List       Accurate as of 12/25/16  1:57 PM. Always use your most recent med list.          aspirin EC 81 MG tablet Take 81 mg by mouth daily.   atorvastatin 10 MG tablet Commonly known as:  LIPITOR Take 10 mg by mouth at bedtime.   bicalutamide 50 MG tablet Commonly known as:  CASODEX Take 50 mg by mouth daily.   feeding supplement (GLUCERNA SHAKE) Liqd Take 237 mLs by mouth 3 (three) times daily with meals. Chocolate   feeding supplement (ENSURE ENLIVE) Liqd Take 237 mLs by mouth 2 (two) times daily between meals.   feeding supplement (PRO-STAT SUGAR FREE 64) Liqd Take 30 mLs by mouth 2 (two) times daily.   ferrous sulfate 325 (65  FE) MG tablet Take 325 mg by mouth daily with breakfast.   gabapentin 100 MG capsule Commonly known as:  NEURONTIN Take 100 mg by mouth at bedtime.   insulin aspart 100 UNIT/ML injection Commonly known as:  novoLOG Inject 0-5 Units into the skin at bedtime.   insulin aspart 100 UNIT/ML injection Commonly known as:  novoLOG Inject 0-9 Units into the skin 3 (three) times daily with meals.   meclizine 25 MG tablet Commonly known as:  ANTIVERT Take 25 mg by mouth 3 (three) times daily as needed for dizziness.   meloxicam 7.5 MG tablet Commonly known as:  MOBIC Take 7.5 mg by mouth daily.   metoprolol tartrate 100 MG tablet Commonly known as:  LOPRESSOR Take 100 mg by mouth daily.   repaglinide 1 MG tablet Commonly known as:  PRANDIN Take 2 mg by mouth 3 (three) times daily before meals.   tamsulosin 0.4 MG Caps capsule Commonly known as:  FLOMAX Take 0.4 mg by mouth daily.       Meds ordered this encounter  Medications  . Amino Acids-Protein Hydrolys (FEEDING SUPPLEMENT, PRO-STAT SUGAR FREE 64,) LIQD    Sig: Take 30 mLs by mouth 2 (two) times daily.  Marland Kitchen  feeding supplement, GLUCERNA SHAKE, (GLUCERNA SHAKE) LIQD    Sig: Take 237 mLs by mouth 3 (three) times daily with meals. Chocolate    Immunization History  Administered Date(s) Administered  . PPD Test 12/21/2016    Social History  Substance Use Topics  . Smoking status: Never Smoker  . Smokeless tobacco: Never Used  . Alcohol use Not on file    Review of Systems  DATA OBTAINED: from patient, nurse GENERAL:  no fevers, fatigue, appetite changes SKIN: No itching, rash HEENT: No complaint RESPIRATORY: No cough, wheezing, SOB CARDIAC: No chest pain, palpitations, lower extremity edema  GI: No abdominal pain, No N/V/D or constipation, No heartburn or reflux  GU: No dysuria, frequency or urgency, or incontinence  MUSCULOSKELETAL: No unrelieved bone/joint pain NEUROLOGIC: No headache, dizziness  PSYCHIATRIC:  No overt anxiety or sadness  Vitals:   12/25/16 1342  BP: (!) 126/57  Pulse: 67  Resp: 20  Temp: 98.2 F (36.8 C)   Body mass index is 19.53 kg/m. Physical Exam  GENERAL APPEARANCE: Alert, conversant, No acute distress  SKIN: No diaphoresis rash HEENT: Unremarkable RESPIRATORY: Breathing is even, unlabored. Lung sounds are clear   CARDIOVASCULAR: Heart RRR no murmurs, rubs or gallops. No peripheral edema  GASTROINTESTINAL: Abdomen is soft, non-tender, not distended w/ normal bowel sounds.  GENITOURINARY: Bladder non tender, not distended  MUSCULOSKELETAL: No abnormal joints or musculature NEUROLOGIC: Cranial nerves 2-12 grossly intact. Moves all extremities PSYCHIATRIC: Mood and affect appropriate to situation, no behavioral issues  Patient Active Problem List   Diagnosis Date Noted  . Protein-calorie malnutrition ([place]) 12/23/2016  . Lung nodules 12/19/2016  . Protein-calorie malnutrition, severe 12/19/2016  . Atypical pneumonia 12/18/2016  . CKD (chronic kidney disease), stage III 12/18/2016  . Normocytic anemia 12/18/2016  . Elevated troponin 12/18/2016  . Type 2 diabetes mellitus with hyperosmolarity without coma, without long-term current use of insulin ([place]) 12/18/2016  . Essential hypertension 12/18/2016  . Coronary artery disease due to lipid rich plaque 12/18/2016  . Weakness 12/17/2016  . Cholangiocarcinoma ([place]) 09/07/2016  . Prostate cancer ([place]) 09/06/2016    CMP     Component Value Date/Time   NA 137 12/19/2016 0553   NA 137 12/19/2016   K 4.4 12/19/2016 0553   CL 100 (L) 12/19/2016 0553   CO2 25 12/19/2016 0553   GLUCOSE 128 (H) 12/19/2016 0553   BUN 12 12/19/2016 0553   BUN 12 12/19/2016   CREATININE 0.98 12/19/2016 0553   CALCIUM 9.5 12/19/2016 0553   PROT 7.7 12/17/2016 1838   ALBUMIN 3.5 12/17/2016 1838   AST 46 (H) 12/17/2016 1838   ALT 40 12/17/2016 1838   ALKPHOS 97 12/17/2016 1838   BILITOT 0.5 12/17/2016 1838   GFRNONAA >60  12/19/2016 0553   GFRAA >60 12/19/2016 0553    Recent Labs  12/17/16 12/17/16 1838 12/19/16 12/19/16 0553  NA 134* 134* 137 137  K 4.7 4.7  --  4.4  CL  --  95*  --  100*  CO2  --  28  --  25  GLUCOSE  --  263*  --  128*  BUN 24* 24* 12 12  CREATININE 1.2 1.24 1.0 0.98  CALCIUM  --  9.3  --  9.5    Recent Labs  12/17/16 1838  AST 46*  ALT 40  ALKPHOS 97  BILITOT 0.5  PROT 7.7  ALBUMIN 3.5    Recent Labs  12/17/16 1838 12/18/16 12/18/16 0742 12/19/16 12/19/16 0553  WBC 7.0  6.3 6.3 7.2 7.2  NEUTROABS 5.1  --   --   --   --   HGB 10.2* 8.9* 8.9*  --  10.4*  HCT 31.8* 28* 28.4*  --  33.0*  MCV 91.6  --  89.0  --  89.9  PLT 158 131* 131*  --  175   No results for input(s): CHOL, LDLCALC, TRIG in the last 8760 hours.  Invalid input(s): HCL No results found for: MICROALBUR No results found for: TSH Lab Results  Component Value Date   HGBA1C 8.5 (H) 12/18/2016   No results found for: CHOL, HDL, LDLCALC, LDLDIRECT, TRIG, CHOLHDL  Significant Diagnostic Results in last 30 days:  Dg Chest 2 View  Result Date: 12/17/2016 CLINICAL DATA:  Exertional shortness breath for several days. EXAM: CHEST  2 VIEW COMPARISON:  None. FINDINGS: Normal sized heart. Post CABG changes. Mild patchy opacities scattered throughout the right lung.a mall poorly defined nodular opacity in the left mid lung zone. Unremarkable bones. IMPRESSION: Mild patchy opacities in the right lung and small poorly defined nodular opacity in the left mid lung zone. These could represent infectious or post infectious changes. A chest CT with contrast may provide useful additional information. Electronically Signed   By: Claudie Revering M.D.   On: 12/17/2016 21:41   Ct Angio Chest Pe W Or Wo Contrast  Result Date: 12/18/2016 CLINICAL DATA:  Shortness of breath and weakness. Possible lung metastases. Cholangiocarcinoma. EXAM: CT ANGIOGRAPHY CHEST WITH CONTRAST TECHNIQUE: Multidetector CT imaging of the chest was  performed using the standard protocol during bolus administration of intravenous contrast. Multiplanar CT image reconstructions and MIPs were obtained to evaluate the vascular anatomy. CONTRAST:  80 cc Isovue 370. COMPARISON:  Chest radiograph 12/17/2016 and CT abdomen pelvis 12/17/2016. FINDINGS: Cardiovascular: Image quality at the lung bases is degraded by respiratory motion. No definite pulmonary embolus. Atherosclerotic calcification of the arterial vasculature. Pulmonary arteries are borderline enlarged. Heart size within normal limits. No pericardial effusion. Mediastinum/Nodes: Mediastinal and hilar lymph nodes are not enlarged by CT size criteria. No axillary adenopathy. Esophagus is grossly unremarkable. Lungs/Pleura: Moderate centrilobular emphysema. Ill-defined areas of nodularity and nodular airspace consolidation are seen peripherally in the lungs bilaterally and appear largely peribronchovascular in distribution, rather than hematogenous in distribution. Nodularity is seen along the fissures. Additionally, there is somewhat of an upper and mid lung zone predominance. Septal thickening is not an overwhelming feature. No pleural fluid. Airway is unremarkable. Upper Abdomen: Pneumobilia with a partially imaged wall stent in the common bile duct. Adrenal glands are unremarkable. Persistent excretion of contrast from the kidneys from yesterday's exam. Low-attenuation lesion off the upper pole left kidney measures 2.5 cm and is likely a cyst. Visualized portions of the spleen, pancreas, stomach and bowel are grossly unremarkable. No upper abdominal adenopathy. Musculoskeletal: Degenerative changes in the spine. No worrisome lytic or sclerotic lesions. Review of the MIP images confirms the above findings. IMPRESSION: 1. Image quality at the lung bases is degraded by respiratory motion. Otherwise, no pulmonary embolus. 2. Perilymphatic distribution of pulmonary nodules and nodular consolidation. Differential  diagnosis includes lymphangitic carcinomatosis in this patient with a reported history of cholangiocarcinoma. Sarcoid can also have this appearance. Atypical infection is considered less likely. 3.  Aortic atherosclerosis (ICD10-170.0). 4.  Emphysema (ICD10-J43.9). Electronically Signed   By: Lorin Picket M.D.   On: 12/18/2016 16:15   Ct Abdomen Pelvis W Contrast  Result Date: 12/17/2016 CLINICAL DATA:  Exertional shortness of breath, history of recently diagnosed  cholangiocarcinoma EXAM: CT ABDOMEN AND PELVIS WITH CONTRAST TECHNIQUE: Multidetector CT imaging of the abdomen and pelvis was performed using the standard protocol following bolus administration of intravenous contrast. CONTRAST:  129mL ISOVUE-300 IOPAMIDOL (ISOVUE-300) INJECTION 61% COMPARISON:  None. FINDINGS: Lower chest: Lung bases demonstrate multiple, greater than 6, peripherally based nodules within the right lower lobe, right middle lobe, lingula and left lower lobe. No pleural effusion or focal consolidation is seen. The heart is nonenlarged. Coronary artery calcifications. Hepatobiliary: Vague hypodense somewhat branching appearing structures in the right hepatic lobe. No focal mass is seen. Pneumobilia a with presence of biliary stent. Fluid or debris in the distal portion of the stent at the duodenum. Small air in the gallbladder. No calcified stones. Ill-defined soft tissue thickening/hypodensity at the distal portion of biliary stent Pancreas: Pancreatic duct is enlarged. Slight bloody appearance at the head of pancreas and second portion of duodenum Spleen: Multiple hypodense subcentimeter splenic lesions. Adrenals/Urinary Tract: Adrenal glands are within normal limits. The kidneys show no hydronephrosis. Cyst upper pole left kidney measuring 2.5 cm. Possible stones versus cortical calcifications in the lower pole of the left kidney. 15 mm cyst lower pole of the right kidney. Parapelvic cysts in the upper pole of the right kidney.  Bladder unremarkable. Stomach/Bowel: Stomach is nonenlarged. No dilated small bowel. No colon wall thickening. Normal appendix. Vascular/Lymphatic: Extensive atherosclerotic calcifications of the aorta. 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 16 mm peripancreatic lymph node. No significantly enlarged pelvic nodes. Reproductive: Enlarged prostate with mass effect on the posterior bladder. Metallic densities at the prostate gland. Other: No free air or free fluid.  Small fat in the umbilicus. Musculoskeletal: 11 mm anterolisthesis of L4 on L5 with fusion of the vertebral bodies. Bilateral pars defect at L4. Retrolisthesis of L5 on S1 with degenerative changes. No suspicious bone lesion. IMPRESSION: 1. Multiple subpleural bilateral lower lobe, right middle lobe and lingular pulmonary nodules suspicious for respiratory infection or atypical pneumonia. Metastatic nodules also possible given history. Non-contrast chest CT at 3-6 months is recommended. If the nodules are stable at time of repeat CT, then future CT at 18-24 months (from today's scan) is considered optional for low-risk patients, but is recommended for high-risk patients. This recommendation follows the consensus statement: Guidelines for Management of Incidental Pulmonary Nodules Detected on CT Images: From the Fleischner Society 2017; Radiology 2017; 284:228-243. 2. Pneumobilia with presence of biliary stent. Small amount of debris or tissue in the distal portion of the stent. Ill-defined hypodensity around the distal stent/head of pancreas, which may relate to history of cholangiocarcinoma. Pancreatic duct is enlarged. 3. Vague peripheral possibly branching hypodensities mostly in the right hepatic lobe, could relate to distal ductal dilatation. 4. Subcentimeter splenic lesions too small to further characterize. 5. Aneurysmal dilatation of the iliac arteries, on the right up to 2.9 cm and on the left up to 1.9 cm. 6. Enlarged  heterogenous prostate gland with mass effect on the posterior bladder. 7. 11 mm anterolisthesis of L4 on L5 with bilateral pars defect at L4 Electronically Signed   By: Donavan Foil M.D.   On: 12/17/2016 22:14    Assessment and Plan  HYPERGLYCEMIA/DM2 - pt was given 15 u for his [place] BS which 2 hours later was still [place] and he was given 10 u more  Which brought it into range. In the meantime I called pt's Endocrinologist in [place], Dr Estell Harpin. I spoke to her nurse  First and on a  later call Dr Delice Lesch. Per Hand P notes from [facility] pt was on orals but per Endocrine pt is on lantus 30 u q HS and 8u per meal plus SSI. When Dr Delice Lesch heard what pt's BS have been she gave me a designer SSI for pt- 200-250- 12 u, 251-300, 14u , 301-350 16u and 351-400 18 u , > 400  18 u. Per his doctor pt is resistent (BS) and stubborn! But very nice, which I already knew.    Time spent > 35 min;> 50% of time with patient was spent reviewing records, labs, tests and studies, counseling and developing plan of care  Willie Bruce. Willie Coil, MD

## 2017-01-05 ENCOUNTER — Emergency Department (HOSPITAL_COMMUNITY): Payer: Medicare Other

## 2017-01-05 ENCOUNTER — Encounter (HOSPITAL_COMMUNITY): Payer: Self-pay | Admitting: Emergency Medicine

## 2017-01-05 ENCOUNTER — Inpatient Hospital Stay (HOSPITAL_COMMUNITY)
Admission: EM | Admit: 2017-01-05 | Discharge: 2017-01-09 | DRG: 064 | Disposition: A | Discharge status: Hospice | Payer: Medicare Other | Attending: Neurology | Admitting: Neurology

## 2017-01-05 ENCOUNTER — Inpatient Hospital Stay (HOSPITAL_COMMUNITY): Payer: Medicare Other

## 2017-01-05 DIAGNOSIS — R29711 NIHSS score 11: Secondary | ICD-10-CM | POA: Diagnosis present

## 2017-01-05 DIAGNOSIS — Z794 Long term (current) use of insulin: Secondary | ICD-10-CM

## 2017-01-05 DIAGNOSIS — E785 Hyperlipidemia, unspecified: Secondary | ICD-10-CM | POA: Diagnosis present

## 2017-01-05 DIAGNOSIS — R29898 Other symptoms and signs involving the musculoskeletal system: Secondary | ICD-10-CM

## 2017-01-05 DIAGNOSIS — Z7982 Long term (current) use of aspirin: Secondary | ICD-10-CM | POA: Diagnosis not present

## 2017-01-05 DIAGNOSIS — Z791 Long term (current) use of non-steroidal anti-inflammatories (NSAID): Secondary | ICD-10-CM

## 2017-01-05 DIAGNOSIS — G8191 Hemiplegia, unspecified affecting right dominant side: Secondary | ICD-10-CM | POA: Diagnosis present

## 2017-01-05 DIAGNOSIS — D696 Thrombocytopenia, unspecified: Secondary | ICD-10-CM | POA: Diagnosis present

## 2017-01-05 DIAGNOSIS — D638 Anemia in other chronic diseases classified elsewhere: Secondary | ICD-10-CM | POA: Diagnosis present

## 2017-01-05 DIAGNOSIS — I63412 Cerebral infarction due to embolism of left middle cerebral artery: Secondary | ICD-10-CM | POA: Diagnosis not present

## 2017-01-05 DIAGNOSIS — Z7189 Other specified counseling: Secondary | ICD-10-CM | POA: Insufficient documentation

## 2017-01-05 DIAGNOSIS — Z801 Family history of malignant neoplasm of trachea, bronchus and lung: Secondary | ICD-10-CM

## 2017-01-05 DIAGNOSIS — E119 Type 2 diabetes mellitus without complications: Secondary | ICD-10-CM | POA: Diagnosis present

## 2017-01-05 DIAGNOSIS — I63512 Cerebral infarction due to unspecified occlusion or stenosis of left middle cerebral artery: Principal | ICD-10-CM | POA: Diagnosis present

## 2017-01-05 DIAGNOSIS — I611 Nontraumatic intracerebral hemorrhage in hemisphere, cortical: Secondary | ICD-10-CM | POA: Diagnosis present

## 2017-01-05 DIAGNOSIS — I10 Essential (primary) hypertension: Secondary | ICD-10-CM | POA: Diagnosis present

## 2017-01-05 DIAGNOSIS — Z66 Do not resuscitate: Secondary | ICD-10-CM | POA: Diagnosis present

## 2017-01-05 DIAGNOSIS — R1310 Dysphagia, unspecified: Secondary | ICD-10-CM | POA: Diagnosis present

## 2017-01-05 DIAGNOSIS — R4701 Aphasia: Secondary | ICD-10-CM | POA: Diagnosis present

## 2017-01-05 DIAGNOSIS — Z515 Encounter for palliative care: Secondary | ICD-10-CM | POA: Diagnosis not present

## 2017-01-05 DIAGNOSIS — Z79899 Other long term (current) drug therapy: Secondary | ICD-10-CM

## 2017-01-05 DIAGNOSIS — D6869 Other thrombophilia: Secondary | ICD-10-CM | POA: Diagnosis present

## 2017-01-05 DIAGNOSIS — C772 Secondary and unspecified malignant neoplasm of intra-abdominal lymph nodes: Secondary | ICD-10-CM | POA: Diagnosis present

## 2017-01-05 DIAGNOSIS — Z951 Presence of aortocoronary bypass graft: Secondary | ICD-10-CM | POA: Diagnosis not present

## 2017-01-05 DIAGNOSIS — I63532 Cerebral infarction due to unspecified occlusion or stenosis of left posterior cerebral artery: Secondary | ICD-10-CM | POA: Diagnosis not present

## 2017-01-05 DIAGNOSIS — E7800 Pure hypercholesterolemia, unspecified: Secondary | ICD-10-CM | POA: Diagnosis present

## 2017-01-05 DIAGNOSIS — I2510 Atherosclerotic heart disease of native coronary artery without angina pectoris: Secondary | ICD-10-CM | POA: Diagnosis present

## 2017-01-05 DIAGNOSIS — C799 Secondary malignant neoplasm of unspecified site: Secondary | ICD-10-CM | POA: Diagnosis not present

## 2017-01-05 DIAGNOSIS — R1312 Dysphagia, oropharyngeal phase: Secondary | ICD-10-CM | POA: Diagnosis not present

## 2017-01-05 DIAGNOSIS — C7800 Secondary malignant neoplasm of unspecified lung: Secondary | ICD-10-CM | POA: Diagnosis present

## 2017-01-05 DIAGNOSIS — C221 Intrahepatic bile duct carcinoma: Secondary | ICD-10-CM | POA: Diagnosis present

## 2017-01-05 DIAGNOSIS — I619 Nontraumatic intracerebral hemorrhage, unspecified: Secondary | ICD-10-CM | POA: Insufficient documentation

## 2017-01-05 DIAGNOSIS — R0602 Shortness of breath: Secondary | ICD-10-CM

## 2017-01-05 LAB — CBC
HCT: 32.8 % — ABNORMAL LOW (ref 39.0–52.0)
Hemoglobin: 10.3 g/dL — ABNORMAL LOW (ref 13.0–17.0)
MCH: 28.7 pg (ref 26.0–34.0)
MCHC: 31.4 g/dL (ref 30.0–36.0)
MCV: 91.4 fL (ref 78.0–100.0)
PLATELETS: 99 10*3/uL — AB (ref 150–400)
RBC: 3.59 MIL/uL — ABNORMAL LOW (ref 4.22–5.81)
RDW: 15.2 % (ref 11.5–15.5)
WBC: 5.8 10*3/uL (ref 4.0–10.5)

## 2017-01-05 LAB — DIFFERENTIAL
BASOS PCT: 0 %
Basophils Absolute: 0 10*3/uL (ref 0.0–0.1)
EOS PCT: 4 %
Eosinophils Absolute: 0.2 10*3/uL (ref 0.0–0.7)
Lymphocytes Relative: 12 %
Lymphs Abs: 0.7 10*3/uL (ref 0.7–4.0)
MONO ABS: 0.4 10*3/uL (ref 0.1–1.0)
Monocytes Relative: 8 %
Neutro Abs: 4.4 10*3/uL (ref 1.7–7.7)
Neutrophils Relative %: 76 %

## 2017-01-05 LAB — I-STAT TROPONIN, ED: TROPONIN I, POC: 0.4 ng/mL — AB (ref 0.00–0.08)

## 2017-01-05 LAB — I-STAT CHEM 8, ED
BUN: 21 mg/dL — ABNORMAL HIGH (ref 6–20)
CALCIUM ION: 1.11 mmol/L — AB (ref 1.15–1.40)
CREATININE: 0.8 mg/dL (ref 0.61–1.24)
Chloride: 103 mmol/L (ref 101–111)
Glucose, Bld: 180 mg/dL — ABNORMAL HIGH (ref 65–99)
HEMATOCRIT: 31 % — AB (ref 39.0–52.0)
HEMOGLOBIN: 10.5 g/dL — AB (ref 13.0–17.0)
Potassium: 4.1 mmol/L (ref 3.5–5.1)
SODIUM: 140 mmol/L (ref 135–145)
TCO2: 29 mmol/L (ref 0–100)

## 2017-01-05 LAB — APTT: aPTT: 30 seconds (ref 24–36)

## 2017-01-05 LAB — PROTIME-INR
INR: 1.23
PROTHROMBIN TIME: 15.6 s — AB (ref 11.4–15.2)

## 2017-01-05 LAB — COMPREHENSIVE METABOLIC PANEL
ALT: 150 U/L — ABNORMAL HIGH (ref 17–63)
ANION GAP: 8 (ref 5–15)
AST: 149 U/L — ABNORMAL HIGH (ref 15–41)
Albumin: 3.1 g/dL — ABNORMAL LOW (ref 3.5–5.0)
Alkaline Phosphatase: 209 U/L — ABNORMAL HIGH (ref 38–126)
BUN: 16 mg/dL (ref 6–20)
CALCIUM: 9 mg/dL (ref 8.9–10.3)
CHLORIDE: 105 mmol/L (ref 101–111)
CO2: 25 mmol/L (ref 22–32)
Creatinine, Ser: 0.96 mg/dL (ref 0.61–1.24)
GFR calc non Af Amer: >60 mL/min (ref >60)
Glucose, Bld: 182 mg/dL — ABNORMAL HIGH (ref 65–99)
Potassium: 3.9 mmol/L (ref 3.5–5.1)
SODIUM: 138 mmol/L (ref 135–145)
Total Bilirubin: 0.7 mg/dL (ref 0.3–1.2)
Total Protein: 6.7 g/dL (ref 6.5–8.1)

## 2017-01-05 LAB — MRSA PCR SCREENING: MRSA by PCR: NEGATIVE

## 2017-01-05 LAB — TROPONIN I: Troponin I: 0.42 ng/mL (ref <0.03)

## 2017-01-05 LAB — CBG MONITORING, ED: Glucose-Capillary: 191 mg/dL — ABNORMAL HIGH (ref 65–99)

## 2017-01-05 MED ORDER — STROKE: EARLY STAGES OF RECOVERY BOOK
Freq: Once | Status: DC
  Filled 2017-01-05: qty 1

## 2017-01-05 MED ORDER — PANTOPRAZOLE SODIUM 40 MG IV SOLR
40.0000 mg | Freq: Every day | INTRAVENOUS | Status: DC
  Administered 2017-01-05 – 2017-01-08 (×4): 40 mg via INTRAVENOUS
  Filled 2017-01-05 (×4): qty 40

## 2017-01-05 MED ORDER — NICARDIPINE HCL IN NACL 20-0.86 MG/200ML-% IV SOLN
INTRAVENOUS | Status: AC
  Administered 2017-01-05: 5 mg/h via INTRAVENOUS
  Filled 2017-01-05: qty 200

## 2017-01-05 MED ORDER — METOPROLOL TARTRATE 25 MG PO TABS: 100.0000 mg | ORAL_TABLET | Freq: Every day | ORAL | Status: DC

## 2017-01-05 MED ORDER — ACETAMINOPHEN 325 MG PO TABS: 650.0000 mg | ORAL_TABLET | ORAL | Status: DC | PRN

## 2017-01-05 MED ORDER — NICARDIPINE HCL IN NACL 20-0.86 MG/200ML-% IV SOLN
3.0000 mg/h | INTRAVENOUS | Status: DC
  Administered 2017-01-05: 5 mg/h via INTRAVENOUS

## 2017-01-05 MED ORDER — ACETAMINOPHEN 160 MG/5ML PO SOLN: 650.0000 mg | ORAL | Status: DC | PRN

## 2017-01-05 MED ORDER — SENNOSIDES-DOCUSATE SODIUM 8.6-50 MG PO TABS: 1.0000 | ORAL_TABLET | Freq: Two times a day (BID) | ORAL | Status: DC

## 2017-01-05 MED ORDER — NICARDIPINE HCL IN NACL 20-0.86 MG/200ML-% IV SOLN
0.0000 mg/h | INTRAVENOUS | Status: DC
  Administered 2017-01-05: 2.5 mg/h via INTRAVENOUS
  Administered 2017-01-05: 10 mg/h via INTRAVENOUS
  Administered 2017-01-06: 2 mg/h via INTRAVENOUS
  Administered 2017-01-06: 1 mg/h via INTRAVENOUS
  Filled 2017-01-05 (×3): qty 200

## 2017-01-05 MED ORDER — ORAL CARE MOUTH RINSE
15.0000 mL | Freq: Two times a day (BID) | OROMUCOSAL | Status: DC
  Administered 2017-01-05 – 2017-01-09 (×5): 15 mL via OROMUCOSAL

## 2017-01-05 MED ORDER — ACETAMINOPHEN 650 MG RE SUPP: 650.0000 mg | RECTAL | Status: DC | PRN

## 2017-01-05 MED ORDER — FERROUS SULFATE 325 (65 FE) MG PO TABS: 325.0000 mg | ORAL_TABLET | Freq: Every day | ORAL | Status: DC

## 2017-01-05 MED ORDER — ATORVASTATIN CALCIUM 10 MG PO TABS: 10.0000 mg | ORAL_TABLET | Freq: Every day | ORAL | Status: DC

## 2017-01-05 NOTE — Code Documentation (Signed)
83yo male arriving to [facility] via [facility] at 0920.  Patient from [facility] facility.  EMS called for altered mental status.  EMS assessed right facial droop, absent right grip and no verbal output and activated a code stroke.  Stroke team at the bedside on patient arrival.  Labs drawn and patient cleared for CT by Dr. Rex Kras.  Patient to CT 1.  NIHSS 11, see documentation for details and code stroke times.  Patient with right facial droop, right sided weakness and dysarthria on exam.  CT showing acute/early subacute hemorrhagic infarction in the occipital lobe.  Patient to D30 with team.  SBP 150s, order for Cardene gtt to maintain SBP < 140.  Patient's daughter to bedside.  Patient with h/o cancer at nursing facility for rehab with plans to discharge home with hospice.  Palliative care consult placed per MD.  Patient to be admitted to ICU, ICU made aware.  Bedside handoff with ED RN Marya Amsler.

## 2017-01-05 NOTE — ED Triage Notes (Signed)
Arrived via EMS from nursing home as a Code Stroke.  LKW unknown. EMS reported call at [facility] and nursing home states [place]. Upon arrival patient able to follow commands, lethargic garbled speech, and right arm weakness.

## 2017-01-05 NOTE — ED Provider Notes (Signed)
[facility] DEPT Provider Note   CSN: [id number] Arrival date & time: 01/05/17  4128   An emergency department physician performed an initial assessment on this suspected stroke patient at 0922 (Willie Bruce).  History   Chief Complaint Chief Complaint  Patient presents with  . Code Stroke    HPI Willie Bruce is a 83 y.o. male.  83 year old male with past medical history including CAD, hypertension, metastatic cholangiocarcinoma who presents with altered mental status. The patient was reportedly normal around 8:30 AM and then suddenly had a change in his mental status, becoming aphasic. Daughter reports that he is normally talkative. He was brought in by EMS as a code stroke. They noted R arm weakness.  LEVEL 5 CAVEAT DUE TO AMS      Past Medical History:  Diagnosis Date  . Cancer ([place])   . Coronary artery disease   . Diabetes mellitus without complication ([place])   . High cholesterol   . Hypertension     Patient Active Problem List   Diagnosis Date Noted  . ICH (intracerebral hemorrhage) ([place]) 01/05/2017  . Protein-calorie malnutrition ([place]) 12/23/2016  . Lung nodules 12/19/2016  . Protein-calorie malnutrition, severe 12/19/2016  . Atypical pneumonia 12/18/2016  . CKD (chronic kidney disease), stage III 12/18/2016  . Normocytic anemia 12/18/2016  . Elevated troponin 12/18/2016  . Type 2 diabetes mellitus with hyperosmolarity without coma, without long-term current use of insulin ([place]) 12/18/2016  . Essential hypertension 12/18/2016  . Coronary artery disease due to lipid rich plaque 12/18/2016  . Weakness 12/17/2016  . Cholangiocarcinoma ([place]) 09/07/2016  . Prostate cancer ([place]) 09/06/2016    Past Surgical History:  Procedure Laterality Date  . CORONARY ARTERY BYPASS GRAFT         Home Medications    Prior to Admission medications   Medication Sig Start Date End Date Taking? Authorizing Provider  Amino Acids-Protein Hydrolys (FEEDING SUPPLEMENT, PRO-STAT  SUGAR FREE 64,) LIQD Take 30 mLs by mouth 2 (two) times daily.    [provider]  aspirin EC 81 MG tablet Take 81 mg by mouth daily.    [provider]  atorvastatin (LIPITOR) 10 MG tablet Take 10 mg by mouth at bedtime.    [provider]  bicalutamide (CASODEX) 50 MG tablet Take 50 mg by mouth daily. 09/19/16   [provider]  feeding supplement, ENSURE ENLIVE, (ENSURE ENLIVE) LIQD Take 237 mLs by mouth 2 (two) times daily between meals. 12/21/16   Geradine Girt, DO  feeding supplement, GLUCERNA SHAKE, (GLUCERNA SHAKE) LIQD Take 237 mLs by mouth 3 (three) times daily with meals. Chocolate    [provider]  ferrous sulfate 325 (65 FE) MG tablet Take 325 mg by mouth daily with breakfast.    [provider]  gabapentin (NEURONTIN) 100 MG capsule Take 100 mg by mouth at bedtime. 11/03/16   [provider]  insulin aspart (NOVOLOG) 100 UNIT/ML injection Inject 0-5 Units into the skin at bedtime. 12/21/16   Geradine Girt, DO  insulin aspart (NOVOLOG) 100 UNIT/ML injection Inject 0-9 Units into the skin 3 (three) times daily with meals. 12/21/16   Geradine Girt, DO  meclizine (ANTIVERT) 25 MG tablet Take 25 mg by mouth 3 (three) times daily as needed for dizziness.    [provider]  meloxicam (MOBIC) 7.5 MG tablet Take 7.5 mg by mouth daily.    [provider]  metoprolol tartrate (LOPRESSOR) 100 MG tablet Take 100 mg by mouth daily.  [provider]  repaglinide (PRANDIN) 1 MG tablet Take 2 mg by mouth 3 (three) times daily before meals.    [provider]  tamsulosin (FLOMAX) 0.4 MG CAPS capsule Take 0.4 mg by mouth daily.    [provider]    Family History Family History  Problem Relation Age of Onset  . Lung cancer Sister     Social History Social History  Substance Use Topics  . Smoking status: Never Smoker  . Smokeless tobacco: Never Used  . Alcohol use No      Allergies   Patient has no known allergies.   Review of Systems Review of Systems  Unable to perform ROS: Patient nonverbal     Physical Exam Updated Vital Signs BP (!) 120/52 (BP Location: Right Arm)   Pulse 73   Temp 97.9 F (36.6 C) (Axillary)   Resp 20   Ht _0  (1.778 m)   Wt 63.5 kg (140 lb)   SpO2 97%   BMI 20.09 kg/m   Physical Exam  Constitutional: No distress.  Awake, thin and chronically ill appearing  HENT:  Head: Normocephalic and atraumatic.  Eyes: Conjunctivae and EOM are normal. Pupils are equal, round, and reactive to light.  Neck: Neck supple.  Cardiovascular: Normal rate, regular rhythm and normal heart sounds.   No murmur heard. Pulmonary/Chest: Effort normal and breath sounds normal.  Abdominal: Soft. Bowel sounds are normal. He exhibits no distension. There is no tenderness.  Musculoskeletal: He exhibits no edema.  Neurological: He is alert.  Expressive aphasia; no obvious facial droop although edentulous making assessment difficult 5/5 strength LUE, BLE 4/5 strength RUE  Skin: Skin is warm and dry.  Nursing note and vitals reviewed.    ED Treatments / Results  Labs (all labs ordered are listed, but only abnormal results are displayed) Labs Reviewed  PROTIME-INR - Abnormal; Notable for the following:       Result Value   Prothrombin Time 15.6 (*)    All other components within normal limits  CBC - Abnormal; Notable for the following:    RBC 3.59 (*)    Hemoglobin 10.3 (*)    HCT 32.8 (*)    Platelets 99 (*)    All other components within normal limits  COMPREHENSIVE METABOLIC PANEL - Abnormal; Notable for the following:    Glucose, Bld 182 (*)    Albumin 3.1 (*)    AST 149 (*)    ALT 150 (*)    Alkaline Phosphatase 209 (*)    All other components within normal limits  I-STAT TROPOININ, ED - Abnormal; Notable for the following:    Troponin i, poc 0.40 (*)    All other components within normal limits  CBG MONITORING, ED  - Abnormal; Notable for the following:    Glucose-Capillary 191 (*)    All other components within normal limits  I-STAT CHEM 8, ED - Abnormal; Notable for the following:    BUN 21 (*)    Glucose, Bld 180 (*)    Calcium, Ion 1.11 (*)    Hemoglobin 10.5 (*)    HCT 31.0 (*)    All other components within normal limits  APTT  DIFFERENTIAL  URINALYSIS, ROUTINE W REFLEX MICROSCOPIC  TROPONIN I  TROPONIN I  TROPONIN I    EKG  EKG Interpretation  Date/Time:  Friday January 05 2017 09:42:34 EDT Ventricular Rate:  68 PR Interval:    QRS Duration: 103 QT Interval:  417 QTC Calculation: 444  R Axis:   90 Text Interpretation:  Sinus rhythm Borderline right axis deviation No significant change since last tracing Confirmed by Theotis Burrow [phone]) on 01/05/2017 10:34:30 AM       Radiology Ct Head Code Stroke W/o Cm  Result Date: 01/05/2017 CLINICAL DATA:  Code stroke. New onset aphasia. Right arm drift. Facial droop. EXAM: CT HEAD WITHOUT CONTRAST TECHNIQUE: Contiguous axial images were obtained from the base of the skull through the vertex without intravenous contrast. COMPARISON:  None. FINDINGS: Brain: There is acute/subacute hemorrhagic infarction in the left posterior parietal lobe. Region of involvement measures approximately 2 x 3 x 4 cm. Mild adjacent edema. This insult may be 36 to 56 days old. No subarachnoid blood. No extra-axial blood. Elsewhere, the brain shows generalized atrophy with chronic small-vessel ischemic changes of the white matter. No hydrocephalus. Vascular: There is atherosclerotic calcification of the major vessels at the base of the brain. Skull: Negative Sinuses/Orbits: Clear/normal Other: None significant ASPECTS ([place] Stroke Program Early CT Score) - Ganglionic level infarction (caudate, lentiform nuclei, internal capsule, insula, M1-M3 cortex): 6 - Supraganglionic infarction (M4-M6 cortex): 3 Total score (0-10 with 10 being normal): 9 IMPRESSION: 1. Acute/early  subacute hemorrhagic infarction in the occipital lobe. No extra-axial blood. This may be 93-58 days old. 2. ASPECTS is 9 These results were called by telephone at the time of interpretation on 01/05/2017 at 9:41 am to Dr. Cristobal Goldmann, who verbally acknowledged these results. Electronically Signed   By: Nelson Chimes M.D.   On: 01/05/2017 09:46    Procedures .Critical Care Performed by: Sharlett Iles Authorized by: Sharlett Iles   Critical care provider statement:    Critical care time (minutes):  30   Critical care was necessary to treat or prevent imminent or life-threatening deterioration of the following conditions:  CNS failure or compromise   Critical care was time spent personally by me on the following activities:  Development of treatment plan with patient or surrogate, discussions with consultants, examination of patient, obtaining history from patient or surrogate, ordering and performing treatments and interventions, ordering and review of laboratory studies, ordering and review of radiographic studies, re-evaluation of patient's condition and review of old charts   (including critical care time)  Medications Ordered in ED Medications   stroke: mapping our early stages of recovery book (not administered)  acetaminophen (TYLENOL) tablet 650 mg (not administered)    Or  acetaminophen (TYLENOL) solution 650 mg (not administered)    Or  acetaminophen (TYLENOL) suppository 650 mg (not administered)  senna-docusate (Senokot-S) tablet 1 tablet (not administered)  pantoprazole (PROTONIX) injection 40 mg (not administered)  nicardipine (CARDENE) 22m in 0.86% saline 2061mIV infusion (0.1 mg/ml) (7.5 mg/hr Intravenous Rate/Dose Change 01/05/17 1020)  atorvastatin (LIPITOR) tablet 10 mg (not administered)  ferrous sulfate tablet 325 mg (not administered)  metoprolol tartrate (LOPRESSOR) tablet 100 mg (not administered)     Initial Impression / Assessment and Plan / ED Course    I have reviewed the triage vital signs and the nursing notes.  Pertinent labs & imaging results that were available during my care of the patient were reviewed by me and considered in my medical decision making (see chart for details).    Pt brought in as code stroke for sudden aphasia and R arm weakness. Code stroke called by EMS, pt awake and protecting airway on arrival. Taken to CT scanner where he was met by neurology, Dr. ShCristobal GoldmannHead CT shows acute versus early subacute hemorrhagic infarction  and occipital lobe. Patient deemed not a TPA candidate given hemorrhage. Discussed findings with his daughter. His lab work shows troponin of 0.4, which may be secondary to the hemorrhage itself. He is not severely hypertensive at this time and currently does not need aggressive blood pressure management in the ED. Patient admitted to the neuro ICU for further care.  Final Clinical Impressions(s) / ED Diagnoses   Final diagnoses:  Hemorrhagic stroke ([place])  Right arm weakness  Aphasia    New Prescriptions New Prescriptions   No medications on file     Georganne Siple, Wenda Overland, MD 01/05/17 [phone]

## 2017-01-05 NOTE — ED Notes (Signed)
Neurology at bedside speaking with daughter at bedside.

## 2017-01-05 NOTE — Progress Notes (Signed)
CRITICAL VALUE ALERT  Critical Value:  Trop 0.42 Date & Time Notied:  01/05/17  2000  Provider Notified: Cheral Marker  Orders Received/Actions taken: No new orders

## 2017-01-05 NOTE — Progress Notes (Signed)
No charge note  Spoke with Katheran Denvil and Dr. Cristobal Goldmann regarding MRI, full stroke work up and Intubation.  Kim decided to avoid the MRI as it would not likely change Mr. Roll's outcome or management.  Kim understands and would like to move forward with an echocardiogram and serial T1 blood work as doing so may lead to changes in medication management.   With regard to intubation, [name] does not want her father to be intubated.  If his respiratory status declines she is comfortable with oxygen, bronchodilators, and BiPAP but she does not want him intubated under any circumstances.  She states he has indicated in the past that he did not want intubation.  Willie Bruce, [place] Palliative Medicine Pager: [phone]

## 2017-01-05 NOTE — Consult Note (Signed)
Consultation Note Date: 01/05/2017   Patient Name: Willie Bruce  DOB: 1926-09-13  MRN: [id number]  Age / Sex: 83 y.o., male  PCP: System, Pcp Not In Referring Physician: Norman Clay, MD  Reason for Consultation: Establishing goals of care  HPI/Patient Profile: 83 y.o. male  with past medical history of CAD, cholangiocarcinoma (dx in Jan in [place])  who was admitted on 01/05/2017 with hemorrhagic stroke.   Clinical Assessment and Goals of Care:  I have reviewed medical records including EPIC notes, labs and imaging, received report from Dr. Cristobal Bruce of Neurology, assessed the patient and then met in the ER at the bedside along with his dtr Willie Bruce  to discuss diagnosis prognosis, GOC, EOL wishes, disposition and options.  I introduced Palliative Medicine as specialized medical care for people living with serious illness. It focuses on providing relief from the symptoms and stress of a serious illness. The goal is to improve quality of life for both the patient and the family.  We discussed a brief life review of the patient. 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   [facility] to dyspnea. He was found to have pulmonary involvement of his cancer.  Recommendations from oncology were for Palliative Care.   He was discharged to acute rehab with a plan for Hospice at home after rehab.  At rehab he has been ambulating and eating well.  He was due for discharge to home on July 5.  This morning he was found altered and weak on the right side and sent to the ER.  He was found to have a left occipital hemorrhagic stroke and is being admitted to neuro ICU for care.  I spoke with Willie Bruce about Willie Bruce condition and next steps.  She feels she would not  know how to care for him at home given the stroke.  Both she and her husband work and she has a 11 year old son.  We discussed the importance of know whether or not he will be able to eat.  At SNF he expressed that he would not want to be fed artificially.  We discussed PEG tubes and aspiration pneumonia.  I provided a "Hard Choices" booklet and strongly encouraged her to read the chapter on feeding tubes.    We decided that it would be best to admit him to the hospital to see if the bleed is stable or will it expand.  If he declines then Willie Bruce understands that her father would receive the best care at hospice house.  This is also true is he is unable to eat or drink without severe aspiration.   Conversely, if he is able to eat and sustain himself he will likely return to SNF with hospice.    Willie Bruce is feeling overwhelmed at this point.  She is unable to care for him at home.  We agreed that a brief stay in the hospital will clarify whether or not he will stabilize and help her make decisions for next steps.  Hospice and Palliative Care services outpatient were explained and offered.  Questions and concerns were addressed.  Hard Choices booklet left for review. The family was encouraged to call with questions or concerns.  PMT will continue to support holistically.   Primary Decision Maker:  Willie Bruce     SUMMARY OF RECOMMENDATIONS     Need to know if the bleeding will stop / stabilize.    Family is a agreeable that a full stroke work up is unnecessary as it will not change his outcome.  Will defer to attending regarding the degree of work up needed.  Need to know if he will be able to eat/drink.    PEG tube is NOT RECOMMENDED at this point as it will not prevent aspiration.    Pt indicated in the past that he did not want a feeding tube   Full DNR/DNI  Code Status/Advance Care Planning:  DNR    Symptom Management:   Per primary team   Psycho-social/Spiritual:   Desire for  further Chaplaincy support: No  Prognosis:   Unable to determine  Less than 6 months based on untreated metastatic cholangiocarcinoma.  Now prognosis will depend on whether or not he has the ability to eat / drink and sustain himself.  Discharge Planning: To Be Determined SNF with Hospice       Primary Diagnoses: Present on Admission: . ICH (intracerebral hemorrhage) ([place])   I have reviewed the medical record, interviewed the patient and family, and examined the patient. The following aspects are pertinent.  Past Medical History:  Diagnosis Date  . Cancer ([place])   . Coronary artery disease   . Diabetes mellitus without complication ([place])   . High cholesterol   . Hypertension    Social History   Social History  . Marital status: Divorced    Spouse name: N/A  . Number of children: N/A  . Years of education: N/A   Social History Main Topics  . Smoking status: Never Smoker  . Smokeless tobacco: Never Used  . Alcohol use No  . Drug use: No  . Sexual activity: Not Asked   Other Topics Concern  . None   Social History Narrative  . None   Family History  Problem Relation Age of Onset  . Lung cancer Sister    Scheduled Meds: .  stroke: mapping our early stages of recovery book   Does not apply Once  . atorvastatin  10 mg Oral QHS  . [START ON 01/06/2017] ferrous sulfate  325 mg Oral Q breakfast  . metoprolol tartrate  100 mg Oral Daily  . pantoprazole (PROTONIX) IV  40 mg Intravenous QHS  . senna-docusate  1 tablet Oral BID   Continuous Infusions: . niCARDipine 2.5 mg/hr (01/05/17 1224)   PRN Meds:.acetaminophen **OR** acetaminophen (TYLENOL) oral liquid 160 mg/5 mL **OR** acetaminophen No Known Allergies Review of Systems patient unable to speak due to stroke  Physical Exam  Well developed elderly male.  Lying in ER unable to speak, appears frustrated CV rrr resp no distress Abdomen soft nt nd Neuro - unable to form words, able to follow 1 step commands,  right sided extremity weakness  Vital Signs: BP 102/68   Pulse 79   Temp 97.9 F (36.6 C) (Axillary)  Resp (!) 25   Ht 5' 10"  (1.778 m)   Wt 63.5 kg (140 lb)   SpO2 96%   BMI 20.09 kg/m        SpO2: SpO2: 96 % O2 Device:SpO2: 96 % O2 Flow Rate: .   IO: Intake/output summary: No intake or output data in the 24 hours ending 01/05/17 1321  LBM:   Baseline Weight: Weight: 63.5 kg (140 lb) Most recent weight: Weight: 63.5 kg (140 lb)     Palliative Assessment/Data:   Flowsheet Rows     Most Recent Value  Intake Tab  Referral Department  Neurology  Unit at Time of Referral  ICU  Palliative Care Primary Diagnosis  Neurology  Date Notified  01/05/17  Palliative Care Type  Return patient Palliative Care  Reason for referral  Clarify Goals of Care  Date of Admission  01/05/17  Date first seen by Palliative Care  01/05/17  # of days Palliative referral response time  0 Day(s)  # of days IP prior to Palliative referral  0  Clinical Assessment  Palliative Performance Scale Score  20%  Psychosocial & Spiritual Assessment  Palliative Care Outcomes  Patient/Family meeting held?  Yes  Who was at the meeting?  patient and Dtr [place] Outcomes  Clarified goals of care      Time In: 12:00 Time Out: 1:15  Time Total: 75 min Greater than 50%  of this time was spent counseling and coordinating care related to the above assessment and plan.  Signed by: Imogene Burn, PA-C Palliative Medicine Pager: [phone]  Please contact Palliative Medicine Team phone at [phone] for questions and concerns.  For individual provider: See Shea Evans

## 2017-01-05 NOTE — H&P (Addendum)
HISTORY AND PHYSICAL      Chief Complaint: AMS and R sided weakness   History obtained from:  EMS via Rehab  HPI:                                                                                                                                         Willie Bruce is an 83 y.o. male with hx as below was diagnosed with possible Lung CA last month, placed on pallaitve care service and transferred to rehab to get stronger and transition to hospice care at home once able to leave the rehab. This morning, time remains unclear with rehab facility, he became altered and weaker on the R side, code stroke was called and was transferred to [facility]. On [place] it was seen to have a L occipital bleed.  ICH 1  NIHSS 11    Past Medical History:  Diagnosis Date  . Cancer ([place])   . Coronary artery disease   . Diabetes mellitus without complication ([place])   . High cholesterol   . Hypertension     Past Surgical History:  Procedure Laterality Date  . CORONARY ARTERY BYPASS GRAFT      Family History  Problem Relation Age of Onset  . Lung cancer Sister    Social History:  reports that he has never smoked. He has never used smokeless tobacco. He reports that he does not drink alcohol or use drugs.  Allergies: No Known Allergies  Medications:                                                                                                                           I have reviewed the patient's current medications.  ROS:  History obtained from chart review  General ROS: dificult to assess due to mental status  Neurologic Examination:                                                                                                      Blood pressure (!) 156/66, pulse 66, temperature 97.9 F (36.6 C),  temperature source Axillary, resp. rate (!) 21, height 5' 10"  (1.778 m), weight 63.5 kg (140 lb), SpO2 97 %.  HEENT-  Normocephalic, no lesions, without obvious abnormality.  Normal external eye and conjunctiva.  Normal TM's bilaterally.  Normal auditory canals and external ears. Normal external nose, mucus membranes and septum.  Normal pharynx. Cardiovascular- regular rate and rhythm, S1, S2 normal, no murmur, click, rub or gallop, pulses palpable throughout   Lungs- chest clear, no wheezing, rales, normal symmetric air entry, Heart exam - S1, S2 normal, no murmur, no gallop, rate regular Abdomen- soft, non-tender; bowel sounds normal; no masses,  no organomegaly   Neurological Examination Mental Status: Drowsy with moderate to severe dysarthria and mild expressive aphasia , able to follow some simple commands Cranial Nerves: II: Visual fields grossly normal,  III,IV, VI: ptosis not present, extra-ocular motions intact bilaterally, pupils equal, round, reactive to light and accommodation V,VII: R facial droop. facial light touch sensation normal bilaterally VIII: hearing normal bilaterally XII: midline tongue extension Motor: Right : Upper extremity   4/5 with drift    Left:     Upper extremity   5/5  Lower extremity   3/5     Lower extremity   5/5 Tone and bulk:normal tone throughout; no atrophy noted Sensory: Pinprick and light touch intact throughout, bilaterally Cerebellar: normal finger-to-nose       Lab Results: Basic Metabolic Panel:  Recent Labs Lab 01/05/17 0724 01/05/17 0930  NA 138 140  K 3.9 4.1  CL 105 103  CO2 25  --   GLUCOSE 182* 180*  BUN 16 21*  CREATININE 0.96 0.80  CALCIUM 9.0  --     Liver Function Tests:  Recent Labs Lab 01/05/17 0724  AST 149*  ALT 150*  ALKPHOS 209*  BILITOT 0.7  PROT 6.7  ALBUMIN 3.1*   No results for input(s): LIPASE, AMYLASE in the last 168 hours. No results for input(s): AMMONIA in the last 168  hours.  CBC:  Recent Labs Lab 01/05/17 0724 01/05/17 0930  WBC 5.8  --   NEUTROABS 4.4  --   HGB 10.3* 10.5*  HCT 32.8* 31.0*  MCV 91.4  --   PLT 99*  --     Cardiac Enzymes: No results for input(s): CKTOTAL, CKMB, CKMBINDEX, TROPONINI in the last 168 hours.  Lipid Panel: No results for input(s): CHOL, TRIG, HDL, CHOLHDL, VLDL, LDLCALC in the last 168 hours.  CBG:  Recent Labs Lab 01/05/17 0951  GLUCAP 191*    Microbiology: No results found for this or any previous visit.  Coagulation Studies:  Recent Labs  01/05/17 0724  LABPROT 15.6*  INR 1.23    Imaging: Ct Head Code Stroke W/o Cm  Result  Date: 01/05/2017 CLINICAL DATA:  Code stroke. New onset aphasia. Right arm drift. Facial droop. EXAM: CT HEAD WITHOUT CONTRAST TECHNIQUE: Contiguous axial images were obtained from the base of the skull through the vertex without intravenous contrast. COMPARISON:  None. FINDINGS: Brain: There is acute/subacute hemorrhagic infarction in the left posterior parietal lobe. Region of involvement measures approximately 2 x 3 x 4 cm. Mild adjacent edema. This insult may be 37 to 54 days old. No subarachnoid blood. No extra-axial blood. Elsewhere, the brain shows generalized atrophy with chronic small-vessel ischemic changes of the white matter. No hydrocephalus. Vascular: There is atherosclerotic calcification of the major vessels at the base of the brain. Skull: Negative Sinuses/Orbits: Clear/normal Other: None significant ASPECTS ([place] Stroke Program Early CT Score) - Ganglionic level infarction (caudate, lentiform nuclei, internal capsule, insula, M1-M3 cortex): 6 - Supraganglionic infarction (M4-M6 cortex): 3 Total score (0-10 with 10 being normal): 9 IMPRESSION: 1. Acute/early subacute hemorrhagic infarction in the occipital lobe. No extra-axial blood. This may be 58-16 days old. 2. ASPECTS is 9 These results were called by telephone at the time of interpretation on 01/05/2017 at 9:41 am  to Dr. Cristobal Goldmann, who verbally acknowledged these results. Electronically Signed   By: Nelson Chimes M.D.   On: 01/05/2017 09:46      Assessment: 83 y.o. male with hx as below was diagnosed with possible Lung CA last month, placed on pallaitve care service and transferred to rehab to get stronger and transition to hospice care at home once able to leave the rehab. This morning, time remains unclear with rehab facility, he became altered and weaker on the R side, code stroke was called and was transferred to [facility]. On [place] it was seen to have a L occipital bleed.   - Discussed case with pallaitive care and daughter. They will come see him when able. Daughter states he is a DNR but undecided on DNI. Will take time to discuss it with family. I explained that there is a chance he may not be able to maintain his airway so until she decides he is not a DNI  Elevated TNI, may be related to stroke/ICH, will check serial TNI's, if continue to increase will consult Cardiology. EKG done and NSR  ICH vs Stroke with hemorraghic conversion vs Met   - Admit to NICU - Frequent neuro checks - SBP < 140 - Serial TNI's - Speech eval - CXR - Echo - Repeat CTH in the am - May consider CTA head and neck if this turns out to be a hemorraghic stroke - Hold all antiplatelet and sub q heparin for DVT ppx until stable ICH on CTH - SCD's for DVT ppx - MRI brain with con to eval for met due to hx of lung CA- will d/c as daughter does not want it - Pallaitive care consult - Restart Home meds if passes swallow - DNR but not DNI

## 2017-01-06 ENCOUNTER — Other Ambulatory Visit (HOSPITAL_COMMUNITY): Payer: Medicare Other

## 2017-01-06 ENCOUNTER — Inpatient Hospital Stay (HOSPITAL_COMMUNITY): Payer: Medicare Other

## 2017-01-06 DIAGNOSIS — Z515 Encounter for palliative care: Secondary | ICD-10-CM

## 2017-01-06 DIAGNOSIS — C7800 Secondary malignant neoplasm of unspecified lung: Secondary | ICD-10-CM

## 2017-01-06 DIAGNOSIS — I63412 Cerebral infarction due to embolism of left middle cerebral artery: Secondary | ICD-10-CM

## 2017-01-06 DIAGNOSIS — C221 Intrahepatic bile duct carcinoma: Secondary | ICD-10-CM

## 2017-01-06 DIAGNOSIS — I619 Nontraumatic intracerebral hemorrhage, unspecified: Secondary | ICD-10-CM

## 2017-01-06 DIAGNOSIS — R4701 Aphasia: Secondary | ICD-10-CM

## 2017-01-06 LAB — COMPREHENSIVE METABOLIC PANEL
ALBUMIN: 3.2 g/dL — AB (ref 3.5–5.0)
ALK PHOS: 224 U/L — AB (ref 38–126)
ALT: 128 U/L — AB (ref 17–63)
ANION GAP: 9 (ref 5–15)
AST: 100 U/L — ABNORMAL HIGH (ref 15–41)
BUN: 11 mg/dL (ref 6–20)
CALCIUM: 9.3 mg/dL (ref 8.9–10.3)
CHLORIDE: 104 mmol/L (ref 101–111)
CO2: 26 mmol/L (ref 22–32)
CREATININE: 0.94 mg/dL (ref 0.61–1.24)
GFR calc non Af Amer: >60 mL/min (ref >60)
GLUCOSE: 185 mg/dL — AB (ref 65–99)
Potassium: 3.9 mmol/L (ref 3.5–5.1)
SODIUM: 139 mmol/L (ref 135–145)
Total Bilirubin: 1 mg/dL (ref 0.3–1.2)
Total Protein: 6.7 g/dL (ref 6.5–8.1)

## 2017-01-06 LAB — URINALYSIS, COMPLETE (UACMP) WITH MICROSCOPIC
BILIRUBIN URINE: NEGATIVE
Glucose, UA: >=500 mg/dL — AB
Ketones, ur: NEGATIVE mg/dL
Leukocytes, UA: NEGATIVE
NITRITE: NEGATIVE
PH: 5 (ref 5.0–8.0)
Protein, ur: 100 mg/dL — AB
SPECIFIC GRAVITY, URINE: 1.016 (ref 1.005–1.030)
Squamous Epithelial / LPF: NONE SEEN

## 2017-01-06 LAB — CBC
HCT: 32.7 % — ABNORMAL LOW (ref 39.0–52.0)
HEMOGLOBIN: 10.2 g/dL — AB (ref 13.0–17.0)
MCH: 28.6 pg (ref 26.0–34.0)
MCHC: 31.2 g/dL (ref 30.0–36.0)
MCV: 91.6 fL (ref 78.0–100.0)
PLATELETS: 123 10*3/uL — AB (ref 150–400)
RBC: 3.57 MIL/uL — AB (ref 4.22–5.81)
RDW: 15.3 % (ref 11.5–15.5)
WBC: 6.4 10*3/uL (ref 4.0–10.5)

## 2017-01-06 LAB — GLUCOSE, CAPILLARY
GLUCOSE-CAPILLARY: 220 mg/dL — AB (ref 65–99)
Glucose-Capillary: 237 mg/dL — ABNORMAL HIGH (ref 65–99)

## 2017-01-06 LAB — TROPONIN I: TROPONIN I: 0.4 ng/mL — AB (ref <0.03)

## 2017-01-06 MED ORDER — INSULIN ASPART 100 UNIT/ML ~~LOC~~ SOLN
0.0000 [IU] | Freq: Four times a day (QID) | SUBCUTANEOUS | Status: DC
  Administered 2017-01-06: 1 [IU] via SUBCUTANEOUS
  Administered 2017-01-06 – 2017-01-09 (×2): 3 [IU] via SUBCUTANEOUS
  Administered 2017-01-09: 2 [IU] via SUBCUTANEOUS
  Administered 2017-01-09: 1 [IU] via SUBCUTANEOUS

## 2017-01-06 MED ORDER — HALOPERIDOL LACTATE 5 MG/ML IJ SOLN
2.0000 mg | Freq: Four times a day (QID) | INTRAMUSCULAR | Status: DC | PRN
  Administered 2017-01-06 – 2017-01-07 (×3): 2 mg via INTRAVENOUS
  Filled 2017-01-06 (×3): qty 1

## 2017-01-06 MED ORDER — INSULIN ASPART 100 UNIT/ML ~~LOC~~ SOLN: 0.0000 [IU] | Freq: Every day | SUBCUTANEOUS | Status: DC

## 2017-01-06 MED ORDER — INSULIN ASPART 100 UNIT/ML ~~LOC~~ SOLN: 0.0000 [IU] | Freq: Four times a day (QID) | SUBCUTANEOUS | Status: DC | PRN

## 2017-01-06 MED ORDER — CLEVIDIPINE BUTYRATE 0.5 MG/ML IV EMUL
1.0000 mg/h | INTRAVENOUS | Status: DC
  Administered 2017-01-06: 3 mg/h via INTRAVENOUS
  Administered 2017-01-06: 2 mg/h via INTRAVENOUS
  Administered 2017-01-07: 5 mg/h via INTRAVENOUS
  Administered 2017-01-07: 2 mg/h via INTRAVENOUS
  Administered 2017-01-07: 5 mg/h via INTRAVENOUS
  Administered 2017-01-07: 4 mg/h via INTRAVENOUS
  Filled 2017-01-06 (×6): qty 50

## 2017-01-06 MED ORDER — INSULIN GLARGINE 100 UNIT/ML ~~LOC~~ SOLN
15.0000 [IU] | Freq: Every day | SUBCUTANEOUS | Status: DC
  Administered 2017-01-06 – 2017-01-07 (×2): 15 [IU] via SUBCUTANEOUS
  Filled 2017-01-06 (×2): qty 0.15

## 2017-01-06 MED ORDER — SODIUM CHLORIDE 0.9 % IV SOLN
INTRAVENOUS | Status: DC
  Administered 2017-01-06 – 2017-01-07 (×3): via INTRAVENOUS

## 2017-01-06 NOTE — Progress Notes (Signed)
STROKE TEAM PROGRESS NOTE   HISTORY OF PRESENT ILLNESS (per record) Willie Bruce is an 83 y.o. male with hx as below was diagnosed with possible Lung CA last month, placed on pallaitve care service and transferred to rehab to get stronger and transition to hospice care at home once able to leave the rehab. This morning, time remains unclear with rehab facility, he became altered and weaker on the R side, code stroke was called and was transferred to [facility]. On [place] it was seen to have a L occipital bleed.  ICH 1 NIHSS 11  SUBJECTIVE (INTERVAL HISTORY) His daughter is at the bedside.  Pt still has expressive aphasia, inconsistent following limited commands. Daughter stated that she talked with palliative care yesterday but now she decided that she would like rescind the decision of DNI as well as no feeding tube. She said he talked with pt last night and he seemed to [facility] with intubation and feeding tube. CT head and swallow evaluation still pending.    OBJECTIVE Temp:  [97.9 F (36.6 C)-99.2 F (37.3 C)] 99.2 F (37.3 C) (06/30 0500) Pulse Rate:  [63-112] 94 (06/30 0730) Cardiac Rhythm: Normal sinus rhythm (06/30 0000) Resp:  [14-39] 27 (06/30 0730) BP: (94-182)/(46-127) 123/63 (06/30 0730) SpO2:  [94 %-100 %] 97 % (06/30 0730) Weight:  [63.5 kg (140 lb)] 63.5 kg (140 lb) (06/29 0956)  CBC:  Recent Labs Lab 01/05/17 0724 01/05/17 0930 01/06/17 0014  WBC 5.8  --  6.4  NEUTROABS 4.4  --   --   HGB 10.3* 10.5* 10.2*  HCT 32.8* 31.0* 32.7*  MCV 91.4  --  91.6  PLT 99*  --  123*    Basic Metabolic Panel:  Recent Labs Lab 01/05/17 0724 01/05/17 0930 01/06/17 0014  NA 138 140 139  K 3.9 4.1 3.9  CL 105 103 104  CO2 25  --  26  GLUCOSE 182* 180* 185*  BUN 16 21* 11  CREATININE 0.96 0.80 0.94  CALCIUM 9.0  --  9.3    Lipid Panel: No results found for: CHOL, TRIG, HDL, CHOLHDL, VLDL, LDLCALC HgbA1c:  Lab Results  Component Value Date   HGBA1C 8.5 (H) 12/18/2016   Urine  Drug Screen: No results found for: LABOPIA, COCAINSCRNUR, LABBENZ, AMPHETMU, THCU, LABBARB  Alcohol Level No results found for: [facility]  IMAGING  Chest Port 1 View 01/05/2017 Increased right mid and lower lung airspace opacities.   Ct Head Code Stroke W/o Cm 01/05/2017 1. Acute/early subacute hemorrhagic infarction in the occipital lobe. No extra-axial blood. This may be 58-60 days old.  2. ASPECTS is 9   Ct Head Wo Contrast 01/06/2017 IMPRESSION: Stable appearance of hemorrhagic infarct LEFT occipital lobe. Newly visualized non hemorrhagic infarct LEFT parietal lobe adjacent to sylvian fissure.   TTE pending  LE venous doppler pending   PHYSICAL EXAM  Temp:  [98.5 F (36.9 C)-99.2 F (37.3 C)] 99.2 F (37.3 C) (06/30 0500) Pulse Rate:  [63-112] 94 (06/30 0845) Resp:  [15-39] 23 (06/30 0845) BP: (94-182)/(46-127) 115/63 (06/30 0845) SpO2:  [94 %-100 %] 100 % (06/30 0845) Weight:  [140 lb (63.5 kg)] 140 lb (63.5 kg) (06/29 0956)  General - Well nourished, well developed, in no apparent distress.  Ophthalmologic - Fundi not visualized due to noncooperation.  Cardiovascular - Regular rate and rhythm.  Neuro - awake, alert but no speech output, not able to name or repeat. Not able to follow most of the commands but inconsistently moved LEs as  requested, however, followed with perseveration. B/l pupil 1.5cm, sluggish to light reaction. Eyes left gaze preference, but able to cross midline, right hemianopia, not blinking to visual threat. Left visual field intact. Right facial droop, tongue in midline inside mouth. LUE spontaneous purposeful movement against gravity. RUE 3/5 with drift to bed but could hold more than 5 sec. LLE 4/5 and RLE 3/5. DTR 1+ and no babinski. Sensation, coordination and gait not tested.   ASSESSMENT/PLAN [name] is a 83 y.o. male with history of hypertension, hyperlipidemia, diabetes mellitus, coronary artery disease, and lung CA (palliative care)  presenting with altered mental status and right-sided weakness. He did not receive IV t-PA due to [place].  Stroke with hemorrhagic transformation: acute/early subacute hemorrhagic infarction in the occipital lobe, and left MCA ischemic stroke. Etiology most likely due to hypercoagulable state secondary to metastatic cholangiocarcinoma with lung and peripancreatic lymph node involvement.   Resultant  Global aphasia, right hemiparesis and right hemianopia  CT head - Acute/early subacute hemorrhagic infarction in the occipital lobe.  CT head repeat - left occipital acute/subacute hemorrhagic infarct and left MCA acute infarct  MRI and MRA head - not able to tolerate at this time due to agitation. Will not change management  CTA head and neck - not able to tolerate at this time due to agitation. Will not change management  LE venous doppler - pending  Carotid doppler - not ordered, will not change management  2D Echo - pending  LDL - pending  HgbA1c - 8.5  VTE prophylaxis - SCDs  Diet NPO time specified  aspirin 81 mg daily prior to admission, now on No antithrombotic secondary to hemorrhagic infarct.  Ongoing aggressive stroke risk factor management  Therapy recommendations: pending  Disposition: Pending  Metastatic extrahepatic cholangiocarcinoma  Diagnosed in 07/2016 in [place]  Untreated  CT, MRCP, ERCP reviewed a 7 x 6 x 6 mm mass in the distal common bile duct, positive for adenocarcinoma, peripancreatic lymph node biopsy also showed metastatic adenocarcinoma.   S/p CBD stent placement.   F/u with [facility] and also had second opinion from Dr. Burr Bruce in [facility]  Recommended palliative care and home hospice.  Hypertension  Stable   On Cleviprex IV  Long-term BP goal < 140  Hyperlipidemia  Home meds:  Lipitor 10 mg daily not resumed  LDL pending  Hold off statin due to elevated LFT  Diabetes  Hb A1c - 8.5 on 12/18/2016  Uncontrolled  hyperglycemia  On  insulin, half home dose due to NPO  SSI  Other Stroke Risk Factors  Advanced age  Coronary artery disease  Other Active Problems  Prostate cancer - in remission  Anemia of chronic disease - 10.2 / 32.7 (stable)  Thrombocytopenia - 99 -> 123  Traumatic Foley placement -> hematuria  Hospital day # 1  This patient is critically ill due to hemorrhagic infarct, stroke, malignancy, hypertensive emergency and at significant risk of neurological worsening, death form recurrent stroke, hemorrhage, heart failure, seizure. This patient's care requires constant monitoring of vital signs, hemodynamics, respiratory and cardiac monitoring, review of multiple databases, neurological assessment, discussion with family, other specialists and medical decision making of high complexity. I spent 45 minutes of neurocritical care time in the care of this patient. I had long discussion with daughter at bedside, updated pt current condition, treatment plan and potential prognosis. She expressed understanding and appreciation.   Rosalin Hawking, MD PhD Stroke Neurology 01/06/2017 11:04 PM  To contact Stroke Continuity provider, please  refer to [email or web address] After hours, contact General Neurology

## 2017-01-06 NOTE — Progress Notes (Signed)
PT Cancellation Note  Patient Details Name: Willie Bruce MRN: [id number] DOB: June 16, 1927   Cancelled Treatment:    Reason Eval/Treat Not Completed: Patient not medically ready Pt on bedrest. Please update activity orders when appropriate for therapy. Thanks   Duncan Dull 01/06/2017, 7:17 AM Alben Deeds, PT DPT [facility] [phone]

## 2017-01-06 NOTE — Progress Notes (Signed)
Pt with increased agitation. Pt attempting to climb out low bed and swinging at staff. Verlon Au, RN, BSN 1:33 PM 01/06/2017

## 2017-01-06 NOTE — Evaluation (Signed)
Clinical/Bedside Swallow Evaluation Patient Details  Name: Willie Bruce MRN: [id number] Date of Birth: 1926-07-26  Today's Date: 01/06/2017 Time: SLP Start Time (ACUTE ONLY): 58 SLP Stop Time (ACUTE ONLY): 0942 SLP Time Calculation (min) (ACUTE ONLY): 27 min  Past Medical History:  Past Medical History:  Diagnosis Date  . Cancer ([place])   . Coronary artery disease   . Diabetes mellitus without complication ([place])   . High cholesterol   . Hypertension    Past Surgical History:  Past Surgical History:  Procedure Laterality Date  . CORONARY ARTERY BYPASS GRAFT     HPI:  Willie Bruce is an 83 y.o. male with hx as below was diagnosed with possible Lung CA last month, placed on pallaitve care service and transferred to rehab to get stronger and transition to hospice care at home once able to leave the rehab. This morning, time remains unclear with rehab facility, he became altered and weaker on the R side, code stroke was called and was transferred to [facility]. On [place] it was seen to have a L occipital bleed. Chest x ray significant for Increased right mid and lower lung airspace opacities   Assessment / Plan / Recommendation Clinical Impression  Pt presents with a suspected moderate oropharyngeal dysphagia of neurogenic etiology. Apparent right sided oral motor deficits observed with suspected involvement of CN V and CN VII, and CN XII resulting in right facial droop, decreased right facial sensation and decreased right lingual ROM and strength. Pt with overt s/sx of reduced airway protection following larger cup sips of thin liquids including immediate and delayed coughing. Nectar thick and honey thick trials without overt s/sx of aspiration however risk for silent aspiration remains increased following CVA.  Persistent right sided anterior spillage evidenced across consistencies from oral motor deficits. Pt noted with impulsivity in feeding with use of left extremity. Pt with impaired mastication of  solid PO and decreased bolus cohesion. Recommend proceed to MBS prior to diet initiation. MBS planned for this am. Daughter present at bedside, educated regarding plan.    SLP Visit Diagnosis: Dysphagia, unspecified (R13.10)    Aspiration Risk  Moderate aspiration risk    Diet Recommendation   NPO until completion of MBS this am   Medication Administration: Via alternative means    Other  Recommendations Oral Care Recommendations: Oral care QID   Follow up Recommendations Skilled Nursing facility;24 hour supervision/assistance      Frequency and Duration min 2x/week  1 week       Prognosis Prognosis for Safe Diet Advancement: Guarded Barriers to Reach Goals: Severity of deficits;Time post onset;Cognitive deficits;Language deficits      Swallow Study   General Date of Onset: 01/05/17 HPI: Willie Bruce is an 83 y.o. male with hx as below was diagnosed with possible Lung CA last month, placed on pallaitve care service and transferred to rehab to get stronger and transition to hospice care at home once able to leave the rehab. This morning, time remains unclear with rehab facility, he became altered and weaker on the R side, code stroke was called and was transferred to [facility]. On [place] it was seen to have a L occipital bleed. Chest x ray significant for Increased right mid and lower lung airspace opacities Type of Study: Bedside Swallow Evaluation Previous Swallow Assessment: none on file Diet Prior to this Study: NPO Temperature Spikes Noted: No Respiratory Status: Room air History of Recent Intubation: No Behavior/Cognition: Confused;Requires cueing;Distractible;Alert Oral Cavity Assessment: Dry Oral Care Completed by SLP:  Yes Oral Cavity - Dentition: Dentures, not available Vision: Impaired for self-feeding Self-Feeding Abilities: Needs assist Patient Positioning: Upright in bed Baseline Vocal Quality: Other (comment);Low vocal intensity (expressive aphasia, limited verbal output  ) Volitional Cough: Cognitively unable to elicit Volitional Swallow: Unable to elicit    Oral/Motor/Sensory Function Overall Oral Motor/Sensory Function: Moderate impairment Facial ROM: Reduced right Facial Symmetry: Abnormal symmetry right Facial Strength: Reduced right Facial Sensation: Reduced right Lingual ROM: Reduced right Lingual Strength: Reduced Lingual Sensation: Reduced   Ice Chips Ice chips: Impaired Presentation: Spoon Oral Phase Impairments: Reduced lingual movement/coordination;Reduced labial seal Oral Phase Functional Implications: Prolonged oral transit Pharyngeal Phase Impairments: Suspected delayed Swallow;Multiple swallows   Thin Liquid Thin Liquid: Impaired Presentation: Cup;Spoon Oral Phase Impairments: Reduced labial seal;Reduced lingual movement/coordination Oral Phase Functional Implications: Prolonged oral transit;Right anterior spillage Pharyngeal  Phase Impairments: Suspected delayed Swallow;Multiple swallows;Cough - Immediate;Cough - Delayed    Nectar Thick Nectar Thick Liquid: Impaired Presentation: Cup;Spoon Oral Phase Impairments: Reduced labial seal;Reduced lingual movement/coordination Oral phase functional implications: Right anterior spillage;Prolonged oral transit Pharyngeal Phase Impairments: Suspected delayed Swallow;Multiple swallows   Honey Thick Honey Thick Liquid: Impaired Presentation: Cup;Spoon Oral Phase Impairments: Reduced labial seal;Reduced lingual movement/coordination Oral Phase Functional Implications: Right anterior spillage;Prolonged oral transit Pharyngeal Phase Impairments: Suspected delayed Swallow;Multiple swallows   Puree Puree: Impaired Presentation: Spoon Oral Phase Impairments: Reduced labial seal;Reduced lingual movement/coordination Oral Phase Functional Implications: Right anterior spillage;Prolonged oral transit Pharyngeal Phase Impairments: Suspected delayed Swallow;Multiple swallows   Solid   GO   Solid:  Impaired Presentation: Spoon Oral Phase Impairments: Reduced labial seal;Reduced lingual movement/coordination;Impaired mastication Oral Phase Functional Implications: Prolonged oral transit;Impaired mastication Pharyngeal Phase Impairments: Suspected delayed Swallow;Multiple swallows       Arvil Chaco MA, CCC-SLP Acute Care Speech Language Pathologist    Dshaun Reppucci E Sumney 01/06/2017,10:00 AM

## 2017-01-06 NOTE — Progress Notes (Signed)
OT Cancellation Note  Patient Details Name: Willie Bruce MRN: [id number] DOB: 1927-01-07   Cancelled Treatment:    Reason Eval/Treat Not Completed: Patient not medically ready. Pt on bedrest. Please update activity orders when appropriate for therapy. Thanks  [place], OT/L  [phone] 01/06/2017 01/06/2017, 6:57 AM

## 2017-01-06 NOTE — Progress Notes (Addendum)
MBS attempted this date however pt with significant decline in mentation as compared to bedside swallow one hour prior and onset of agitation as well as labial tension, not accepting PO. Note repeat CT of head prior to MBS reveals newly visualized non hemorrhagic infarct LEFT parietal lobe.   Recommend continue NPO with SLP follow up next date for PO readiness   Arvil Chaco MA, [place] Acute Care Speech Language Pathologist

## 2017-01-06 NOTE — Evaluation (Signed)
Speech Language Pathology Evaluation Patient Details Name: Willie Bruce MRN: [id number] DOB: September 07, 1926 Today's Date: 01/06/2017 Time: 4580-9983 SLP Time Calculation (min) (ACUTE ONLY): 18 min  Problem List:  Patient Active Problem List   Diagnosis Date Noted  . Aphasia   . Palliative care by specialist   . ICH (intracerebral hemorrhage) ([place]) 01/05/2017  . Hemorrhagic stroke ([place])   . Metastatic cancer ([place])   . Palliative care encounter   . Goals of care, counseling/discussion   . Protein-calorie malnutrition ([place]) 12/23/2016  . Lung nodules 12/19/2016  . Protein-calorie malnutrition, severe 12/19/2016  . Atypical pneumonia 12/18/2016  . CKD (chronic kidney disease), stage III 12/18/2016  . Normocytic anemia 12/18/2016  . Elevated troponin 12/18/2016  . Type 2 diabetes mellitus with hyperosmolarity without coma, without long-term current use of insulin ([place]) 12/18/2016  . Essential hypertension 12/18/2016  . Coronary artery disease due to lipid rich plaque 12/18/2016  . Weakness 12/17/2016  . Cholangiocarcinoma ([place]) 09/07/2016  . Prostate cancer ([place]) 09/06/2016   Past Medical History:  Past Medical History:  Diagnosis Date  . Cancer ([place])   . Coronary artery disease   . Diabetes mellitus without complication ([place])   . High cholesterol   . Hypertension    Past Surgical History:  Past Surgical History:  Procedure Laterality Date  . CORONARY ARTERY BYPASS GRAFT     HPI:  Willie Bruce is an 83 y.o. male with hx as below was diagnosed with possible Lung CA last month, placed on pallaitve care service and transferred to rehab to get stronger and transition to hospice care at home once able to leave the rehab. This morning, time remains unclear with rehab facility, he became altered and weaker on the R side, code stroke was called and was transferred to [facility]. On [place] it was seen to have a L occipital bleed. Chest x ray significant for Increased right mid and lower lung airspace  opacities   Assessment / Plan / Recommendation Clinical Impression  SLP with caregiver interview and informal cognitive linguistic assessment at bedside. Pt presents with severe expressive aphasia s/p left occipital lobe hemmorhage and acute left parietal nonhemmorhagic infarct per CT of head. Suspect receptive language deficits as well however difficult to assess as hx includes HOH and attentional deficits observed this date. Pt with right visual deficits in addition to right neglect.  Pt with occasional verbalizations that were mostly unintelligible as breath support was reduced for speech.   Daughter at bedside who admits the pt has had a significant functional decline during the last few months which prompted her to move him from [place] to [place] to help take care of him. She states that prior to most recent hospitalization pt with recall deficits asking "what date the oil needed to be changed for his car up to 15 times in an hour" and that the pt would forget simple things at times including how to open the car door. Pt presenting with global cognitive deficits this date. ST to continue to follow up for tx of cognitive linguistics, caregiver education, and swallow intervention.         SLP Assessment  SLP Recommendation/Assessment: Patient needs continued Speech Lanaguage Pathology Services SLP Visit Diagnosis: Dysphagia, oropharyngeal phase (R13.12);Aphasia (R47.01);Dysarthria and anarthria (R47.1);Cognitive communication deficit (R41.841)    Follow Up Recommendations  Skilled Nursing facility;24 hour supervision/assistance    Frequency and Duration min 2x/week  2 weeks      SLP Evaluation Cognition  Overall Cognitive Status: Impaired/Different from baseline  Arousal/Alertness: Awake/alert Orientation Level: Other (comment) (significant expressive aphasia ) Attention: Sustained;Focused Focused Attention: Impaired Focused Attention Impairment: Verbal basic;Functional basic Sustained Attention:  Impaired Sustained Attention Impairment: Verbal basic Memory: Impaired Problem Solving: Impaired Safety/Judgment: Impaired       Comprehension  Auditory Comprehension Overall Auditory Comprehension: Other (comment) (difficult to assess ) Yes/No Questions: Impaired Visual Recognition/Discrimination Discrimination: Exceptions to [facility]    Expression Expression Primary Mode of Expression: Nonverbal - gestures Verbal Expression Overall Verbal Expression: Impaired Initiation: Impaired Repetition: Impaired Written Expression Dominant Hand: Right (however impaired by L hemispheric CVA) Written Expression: Exceptions to [facility]   Oral / Motor  Oral Motor/Sensory Function Overall Oral Motor/Sensory Function: Moderate impairment Facial ROM: Reduced right Facial Symmetry: Abnormal symmetry right Facial Strength: Reduced right Facial Sensation: Reduced right Lingual ROM: Reduced right Lingual Strength: Reduced Lingual Sensation: Reduced Motor Speech Overall Motor Speech: Impaired Respiration: Impaired Articulation: Impaired Level of Impairment: Word Interfering Components: Premorbid status;Hearing loss;Inadequate dentition   GO                   Arvil Chaco MA, CCC-SLP Acute Care Speech Language Pathologist    Levi Aland 01/06/2017, 12:12 PM

## 2017-01-06 NOTE — Progress Notes (Signed)
Daily Progress Note   Patient Name: Willie Bruce       Date: 01/06/2017 DOB: Jan 28, 1927  Age: 83 y.o. MRN#: [id number] Attending Physician: Rosalin Hawking, MD Primary Care Physician: System, Pcp Not In Admit Date: 01/05/2017  Reason for Consultation/Follow-up: Establishing goals of care and Psychosocial/spiritual support  Subjective: Pt is alert this am. Daughter at the bedside, feeling hopeful that he his shaking his head yes/no in response to questions. Reportedly when asked about feeding tube if he is aspirating he shook his head "yes" as well as to intubation.   Length of Stay: 1  Current Medications: Scheduled Meds:  .  stroke: mapping our early stages of recovery book   Does not apply Once  . insulin aspart  0-5 Units Subcutaneous QHS  . mouth rinse  15 mL Mouth Rinse BID  . metoprolol tartrate  100 mg Oral Daily  . pantoprazole (PROTONIX) IV  40 mg Intravenous QHS  . senna-docusate  1 tablet Oral BID    Continuous Infusions: . niCARDipine 2.5 mg/hr (01/06/17 0912)    PRN Meds: acetaminophen **OR** acetaminophen (TYLENOL) oral liquid 160 mg/5 mL **OR** acetaminophen, insulin aspart  Physical Exam  Constitutional: He appears well-developed and well-nourished.  Cardiovascular: Normal rate.   Neurological: He is alert.  Shaking his head yes/no; squeezing his daughter's hand  Skin: Skin is warm and dry.  Nursing note and vitals reviewed.           Vital Signs: BP 115/63   Pulse 94   Temp 99.2 F (37.3 C) (Oral)   Resp (!) 23   Ht 5\' 10"  (1.778 m)   Wt 63.5 kg (140 lb)   SpO2 100%   BMI 20.09 kg/m  SpO2: SpO2: 100 % O2 Device: O2 Device: Not Delivered O2 Flow Rate:    Intake/output summary:  Intake/Output Summary (Last 24 hours) at 01/06/17 0937 Last data filed  at 01/06/17 0800  Gross per 24 hour  Intake           377.17 ml  Output             2050 ml  Net         -1672.83 ml   LBM:   Baseline Weight: Weight: 63.5 kg (140 lb) Most recent weight: Weight: 63.5 kg (140 lb)  Palliative Assessment/Data:    Flowsheet Rows     Most Recent Value  Intake Tab  Referral Department  Neurology  Unit at Time of Referral  ICU  Palliative Care Primary Diagnosis  Neurology  Date Notified  01/05/17  Palliative Care Type  Return patient Palliative Care  Reason for referral  Clarify Goals of Care  Date of Admission  01/05/17  Date first seen by Palliative Care  01/05/17  # of days Palliative referral response time  0 Day(s)  # of days IP prior to Palliative referral  0  Clinical Assessment  Palliative Performance Scale Score  20%  Psychosocial & Spiritual Assessment  Palliative Care Outcomes  Patient/Family meeting held?  Yes  Who was at the meeting?  patient and Dtr Cut Off Outcomes  Clarified goals of care      Patient Active Problem List   Diagnosis Date Noted  . ICH (intracerebral hemorrhage) ([place]) 01/05/2017  . Hemorrhagic stroke ([place])   . Metastatic cancer ([place])   . Palliative care encounter   . Goals of care, counseling/discussion   . Protein-calorie malnutrition ([place]) 12/23/2016  . Lung nodules 12/19/2016  . Protein-calorie malnutrition, severe 12/19/2016  . Atypical pneumonia 12/18/2016  . CKD (chronic kidney disease), stage III 12/18/2016  . Normocytic anemia 12/18/2016  . Elevated troponin 12/18/2016  . Type 2 diabetes mellitus with hyperosmolarity without coma, without long-term current use of insulin ([place]) 12/18/2016  . Essential hypertension 12/18/2016  . Coronary artery disease due to lipid rich plaque 12/18/2016  . Weakness 12/17/2016  . Cholangiocarcinoma ([place]) 09/07/2016  . Prostate cancer [facility]) 09/06/2016    Palliative Care Assessment & Plan   Patient Profile: 83 y.o. male  with past medical  history of CAD, cholangiocarcinoma bilateral lung nodules as well as lymphatic spread  (dx in Jan in [place])  who was admitted on 01/05/2017 with hemorrhagic stroke.  Per initial assessment pt's daughter had indicated no intubation and that conversations in the past he had stated no PEG. He is now more alert and indicated otherwise. Repeat CT ordered for today to assess blleed   Recommendations/Plan:  DNR only  Now would want to be intubated if he declines  Now wants a PEG tube if he is aspirating.   The above changes to plan of coarse would need to be revisited if CT scan and/or pt's clinical status reflects worsening conditionn  Goals of Care and Additional Recommendations:  Limitations on Scope of Treatment: Full Scope Treatment except for DNR. Does want intubation and PEG as of conversation today but this maybe in light of clinical improvment  Code Status:    Code Status Orders        Start     Ordered   01/05/17 1012  Do not attempt resuscitation (DNR)  Continuous    Question Answer Comment  In the event of cardiac or respiratory ARREST Do not call a "code blue"   In the event of cardiac or respiratory ARREST Do not perform Intubation, CPR, defibrillation or ACLS   In the event of cardiac or respiratory ARREST Use medication by any route, position, wound care, and other measures to relive pain and suffering. May use oxygen, suction and manual treatment of airway obstruction as needed for comfort.      01/05/17 1014    Code Status History    Date Active Date Inactive Code Status Order ID Comments User Context   01/05/2017 10:09 AM 01/05/2017 10:14 AM DNR [id number]  Norman Clay, MD ED   12/18/2016  1:04 AM 12/21/2016  5:03 PM Full Code [id number]  Danford, Suann Larry, MD Inpatient    Advance Directive Documentation     Most Recent Value  Type of Advance Directive  Living will, Healthcare Power of Attorney  Pre-existing out of facility DNR order (yellow form or pink MOST  form)  -  "MOST" Form in Place?  -       Prognosis:   Unable to determine  Discharge Planning:  To Be Determined  Care plan was discussed with Dr. Erlinda Hong  Thank you for allowing the Palliative Medicine Team to assist in the care of this patient.   Time In: 0830 Time Out: 0855 Total Time 25 min Prolonged Time Billed  no       Greater than 50%  of this time was spent counseling and coordinating care related to the above assessment and plan.  Dory Horn, NP  Please contact Palliative Medicine Team phone at [phone] for questions and concerns.

## 2017-01-06 NOTE — Progress Notes (Signed)
MD paged with pt bloody discharge from genitalia. Dime sized clot in condom cath collection bag. Pt with 600 on bladder scan. To place foley and monitor UO for further bleeding and/or retention. With foley inserted bladder scan was 0.

## 2017-01-07 ENCOUNTER — Inpatient Hospital Stay (HOSPITAL_COMMUNITY): Payer: Medicare Other

## 2017-01-07 DIAGNOSIS — I611 Nontraumatic intracerebral hemorrhage in hemisphere, cortical: Secondary | ICD-10-CM

## 2017-01-07 DIAGNOSIS — I10 Essential (primary) hypertension: Secondary | ICD-10-CM

## 2017-01-07 LAB — GLUCOSE, CAPILLARY
GLUCOSE-CAPILLARY: 105 mg/dL — AB (ref 65–99)
GLUCOSE-CAPILLARY: 133 mg/dL — AB (ref 65–99)
Glucose-Capillary: 104 mg/dL — ABNORMAL HIGH (ref 65–99)

## 2017-01-07 LAB — ECHOCARDIOGRAM COMPLETE
Height: 70 in
Weight: 2240 oz

## 2017-01-07 LAB — CBC
HCT: 33.4 % — ABNORMAL LOW (ref 39.0–52.0)
Hemoglobin: 10.6 g/dL — ABNORMAL LOW (ref 13.0–17.0)
MCH: 28.6 pg (ref 26.0–34.0)
MCHC: 31.7 g/dL (ref 30.0–36.0)
MCV: 90 fL (ref 78.0–100.0)
PLATELETS: 137 10*3/uL — AB (ref 150–400)
RBC: 3.71 MIL/uL — AB (ref 4.22–5.81)
RDW: 15.2 % (ref 11.5–15.5)
WBC: 8.7 10*3/uL (ref 4.0–10.5)

## 2017-01-07 LAB — LIPID PANEL
CHOLESTEROL: 125 mg/dL (ref 0–200)
HDL: 57 mg/dL (ref >40)
LDL Cholesterol: 53 mg/dL (ref 0–99)
Total CHOL/HDL Ratio: 2.2 RATIO
Triglycerides: 73 mg/dL (ref <150)
VLDL: 15 mg/dL (ref 0–40)

## 2017-01-07 LAB — BASIC METABOLIC PANEL
Anion gap: 10 (ref 5–15)
BUN: 9 mg/dL (ref 6–20)
CALCIUM: 9.4 mg/dL (ref 8.9–10.3)
CO2: 25 mmol/L (ref 22–32)
CREATININE: 0.88 mg/dL (ref 0.61–1.24)
Chloride: 103 mmol/L (ref 101–111)
GFR calc Af Amer: >60 mL/min (ref >60)
GLUCOSE: 115 mg/dL — AB (ref 65–99)
Potassium: 4 mmol/L (ref 3.5–5.1)
SODIUM: 138 mmol/L (ref 135–145)

## 2017-01-07 LAB — VITAMIN B12: VITAMIN B 12: 1256 pg/mL — AB (ref 180–914)

## 2017-01-07 LAB — TSH: TSH: 0.49 u[IU]/mL (ref 0.350–4.500)

## 2017-01-07 NOTE — Progress Notes (Signed)
Modified Barium Swallow Progress Note  Patient Details  Name: Willie Bruce MRN: [id number] Date of Birth: May 03, 1927  Today's Date: 01/07/2017  Modified Barium Swallow completed.  Full report located under Chart Review in the Imaging Section.  Brief recommendations include the following:  Clinical Impression  Pt presents with a significant dysphagia that is felt to be sensorimotor and cognitive in nature. He is alert and upright in the chair and will engage in self-feeding trials; however, all boluses are anteriorly held in his mouth, eventually spilling outward from his lips. He did not appear to attempt posterior oral transit with any of the trials. SLP provided different bolus delivery methods, provided physical assistance for head control, and gave Max cues to initiate swallowing including use of dry spoon. Unforunately, no swallows were elicited. his daughter [name] was present for testing and results were reviewed with her. Even without seeing his pharyngeal phase of swallowing to assess for aspiration risk, I do not think the pt would be able to consume enough PO intake to sustain himself adequately. I reviewed again that although PO trials could continue if he has a PEG in place, that the risk of aspiration would remain. I encouraged her to continue discussions with his doctor and the palliative care team to determine what their overall GOC would be.    Swallow Evaluation Recommendations       SLP Diet Recommendations: NPO       Medication Administration: Via alternative means               Oral Care Recommendations: Oral care QID   Other Recommendations: Have oral suction available    Germain Osgood 01/07/2017,1:45 PM   Germain Osgood, M.A. CCC-SLP [phone]

## 2017-01-07 NOTE — Progress Notes (Signed)
Patient's daughter who is patient's health care power of attorney has expressed her wish that neuro check frequency be decreased to qshift as she feels that the checks are producing too much agitation in her father. She has been informed that this would constitute a lower level of care than medically indicated with possible medical complications as a result. The patient' daughter expressed understanding and reiterated her wish for [place] to proceed with decreased frequency of neuro checks to qshift; corresponding verbal order has been placed.  Electronically signed: Dr. Kerney Elbe

## 2017-01-07 NOTE — Progress Notes (Signed)
STROKE TEAM PROGRESS NOTE   SUBJECTIVE (INTERVAL HISTORY) His daughter is at the bedside.  Pt still has expressive aphasia, not following commands. MRI yesterday showed involving left MCA infarct in addition to left PCA infarct with hemorrhagic transformation. Situation more consistent with hypercoagulable state due to metastasis. Dr. requested no unnecessary testing but agree with echocardiogram and blood draws. Pending barium study for swallowing function today. Still on low-dose Cleviprex for BP control.    OBJECTIVE Temp:  [97.5 F (36.4 C)-99 F (37.2 C)] 98.3 F (36.8 C) (07/01 0405) Pulse Rate:  [34-155] 34 (07/01 0645) Cardiac Rhythm: Normal sinus rhythm (06/30 2000) Resp:  [15-33] 22 (07/01 0645) BP: (99-157)/(49-105) 116/69 (07/01 0645) SpO2:  [92 %-100 %] 96 % (07/01 0645)  CBC:  Recent Labs Lab 01/05/17 0724  01/06/17 0014 01/07/17 0240  WBC 5.8  --  6.4 8.7  NEUTROABS 4.4  --   --   --   HGB 10.3*  < > 10.2* 10.6*  HCT 32.8*  < > 32.7* 33.4*  MCV 91.4  --  91.6 90.0  PLT 99*  --  123* 137*  < > = values in this interval not displayed.  Basic Metabolic Panel:   Recent Labs Lab 01/06/17 0014 01/07/17 0240  NA 139 138  K 3.9 4.0  CL 104 103  CO2 26 25  GLUCOSE 185* 115*  BUN 11 9  CREATININE 0.94 0.88  CALCIUM 9.3 9.4    Lipid Panel:     Component Value Date/Time   CHOL 125 01/07/2017 0240   TRIG 73 01/07/2017 0240   HDL 57 01/07/2017 0240   CHOLHDL 2.2 01/07/2017 0240   VLDL 15 01/07/2017 0240   LDLCALC 53 01/07/2017 0240   HgbA1c:  Lab Results  Component Value Date   HGBA1C 8.5 (H) 12/18/2016   Urine Drug Screen: No results found for: LABOPIA, COCAINSCRNUR, LABBENZ, AMPHETMU, THCU, LABBARB  Alcohol Level No results found for: [facility]  IMAGING  Chest Port 1 View 01/05/2017 Increased right mid and lower lung airspace opacities.   Ct Head Code Stroke W/o Cm 01/05/2017 1. Acute/early subacute hemorrhagic infarction in the occipital lobe. No  extra-axial blood. This may be 62-45 days old.  2. ASPECTS is 9   Ct Head Wo Contrast 01/06/2017 IMPRESSION: Stable appearance of hemorrhagic infarct LEFT occipital lobe. Newly visualized non hemorrhagic infarct LEFT parietal lobe adjacent to sylvian fissure.   TTE pending   PHYSICAL EXAM  Temp:  [97.5 F (36.4 C)-99 F (37.2 C)] 98.3 F (36.8 C) (07/01 0405) Pulse Rate:  [34-155] 34 (07/01 0645) Resp:  [15-33] 22 (07/01 0645) BP: (99-157)/(49-105) 116/69 (07/01 0645) SpO2:  [92 %-100 %] 96 % (07/01 0645)  General - Well nourished, well developed, in no apparent distress.  Ophthalmologic - Fundi not visualized due to noncooperation.  Cardiovascular - Regular rate and rhythm.  Neuro - awake, alert but no speech output, not able to name or repeat. Not able to follow most of the commands but inconsistently moved LEs as requested, however, followed with perseveration. B/l pupil 1.5cm, sluggish to light reaction. Eyes left gaze preference, but able to cross midline, right hemianopia, not blinking to visual threat. Left visual field intact. Right facial droop, tongue in midline inside mouth. LUE spontaneous purposeful movement against gravity. RUE 3/5 with drift to bed but could hold more than 5 sec. LLE 4/5 and RLE 3/5. DTR 1+ and no babinski. Sensation, coordination and gait not tested.   ASSESSMENT/PLAN Willie Bruce is  a 83 y.o. male with history of hypertension, hyperlipidemia, diabetes mellitus, coronary artery disease, and lung CA (palliative care) presenting with altered mental status and right-sided weakness. He did not receive IV t-PA due to [place].  Stroke with hemorrhagic transformation: acute/early subacute hemorrhagic infarction in the occipital lobe, and left MCA ischemic stroke. Etiology most likely due to hypercoagulable state secondary to metastatic cholangiocarcinoma to lung and peripancreatic lymph node   Resultant  Global aphasia, right hemiparesis and right  hemianopia  CT head - Acute/early subacute hemorrhagic infarction in the occipital lobe.  CT head repeat - left occipital acute/subacute hemorrhagic infarct and left MCA acute infarct  Hold off other stroke work up per daughter request  2D Echo - pending  LDL - 53  HgbA1c - 8.5  VTE prophylaxis - SCDs Diet NPO time specified  aspirin 81 mg daily prior to admission, now on No antithrombotic secondary to hemorrhagic infarct. May consider ASA in one week and anticoagulation once blood product absorbed.   Ongoing aggressive stroke risk factor management  Therapy recommendations: pending  Disposition: Pending  Dysphagia   Failed Swallow  NPO   plan MBS today   Daughter has not decided on feeding tube yet  Metastatic extrahepatic cholangiocarcinoma  Diagnosed in 07/2016 in [place]  Untreated  CT, MRCP, ERCP reviewed a 7 x 6 x 6 mm mass in the distal common bile duct, positive for adenocarcinoma, peripancreatic lymph node biopsy also showed metastatic adenocarcinoma.   S/p CBD stent placement.   F/u with [facility] and also had second opinion from Dr. Burr Medico in [facility]  Recommended palliative care and home hospice.  Hypertension  Stable   On Cleviprex IV  BP goal < 160  Hyperlipidemia  Home meds:  Lipitor 10 mg daily not resumed  LDL - 53  Hold off statin due to elevated LFT  Repeat LFTs in a.m.  Diabetes  Hb A1c - 8.5 on 12/18/2016  Uncontrolled  hyperglycemia  On insulin, half home dose due to NPO  SSI  Other Stroke Risk Factors  Advanced age  Coronary artery disease  Other Active Problems  Prostate cancer - in remission  Anemia of chronic disease - 10.2 -> 10.6   Thrombocytopenia - 99 -> 123 ->137  Traumatic Foley placement -> hematuria, improved  Hospital day # 2  This patient is critically ill due to hemorrhagic infarct, stroke, malignancy, hypertensive emergency and at significant risk of neurological worsening, death form  recurrent stroke, hemorrhage, heart failure, seizure. This patient's care requires constant monitoring of vital signs, hemodynamics, respiratory and cardiac monitoring, review of multiple databases, neurological assessment, discussion with family, other specialists and medical decision making of high complexity. I spent 45 minutes of neurocritical care time in the care of this patient. I had long discussion with daughter at bedside, updated pt current condition, treatment plan and potential prognosis. She expressed understanding and appreciation.   Rosalin Hawking, MD PhD Stroke Neurology 01/07/2017 12:29 PM   To contact Stroke Continuity provider, please refer to [email or web address] After hours, contact General Neurology

## 2017-01-07 NOTE — Progress Notes (Signed)
  Echocardiogram 2D Echocardiogram has been performed.  Kalmen Lollar 01/07/2017, 12:02 PM

## 2017-01-07 NOTE — Progress Notes (Addendum)
Pt daughter updated with current pt status. Daughter expressed concern over frequent neuro assessments and pt agitation. Wishes to stop frequent checks. She is aware this is a decrease in level of care. She refused morning CBG/insulin. Does not want to make definitive reduction of care until speaks with palliative.   MD Lindzen made aware and will go to once a shift neuro checks. Will readdress once daughter has spoken with palliative.

## 2017-01-07 NOTE — Progress Notes (Signed)
PT Cancellation Note  Patient Details Name: Willie Bruce MRN: [id number] DOB: 09/18/1926   Cancelled Treatment:    Reason Eval/Treat Not Completed: Patient not medically ready (active bedrest)   Duncan Dull 01/07/2017, 8:09 AM Alben Deeds, PT DPT NCS [phone]

## 2017-01-07 NOTE — Progress Notes (Signed)
  Speech Language Pathology Treatment: Dysphagia  Patient Details Name: Willie Bruce MRN: [id number] DOB: 1926-10-16 Today's Date: 01/07/2017 Time: 0825-0902 SLP Time Calculation (min) (ACUTE ONLY): 37 min  Assessment / Plan / Recommendation Clinical Impression  Pt primarily keeps his eyes closed but is alert and engaged in self-feeding. He has immediate and delayed coughing consistently with thin liquids and intermittently with nectar thick liquids, with cough weak and likely ineffective at clearing suspected penetrates/aspirates. Significant amounts of right-sided anterior loss is noted seemingly without awareness, raising concern for risk of reduced pharyngeal sensation as well. He produced a more effortful cough x1 to command. No coughing is observed with pureed trials, but he requires >30 seconds and Mod-Max cues for oral transit.   Discussed options with his daughter, Willie Bruce, about how to proceed given clinical signs of dysphagia and possible aspiration. At this time she is in favor of trying an additional time to complete MBS to better assess oropharyngeal swallowing. She still seems to favor the option of a PEG if deemed necessary, although I did reiterate to her that it would not prevent him from aspirating if he is in fact doing so. Given his overall medical status and deconditioning, he would likely be at a higher risk for aspiration-related infection as well. Should the pt not be able to complete MBS today, she would be more willing to attempt a more conservative PO diet, accepting the risks of aspiration. I also shared my concern for a fluctuating risk throughout the day as his mentation waxes/wanes, and she acknowledged her understanding of this as well. For now, will plan to re-attempt MBS later this morning.   HPI HPI: Willie Bruce is an 83 y.o. male with hx as below was diagnosed with possible Lung CA last month, placed on pallaitve care service and transferred to rehab to get stronger and  transition to hospice care at home once able to leave the rehab. This morning, time remains unclear with rehab facility, he became altered and weaker on the R side, code stroke was called and was transferred to Willie Bruce. On Willie Bruce it was seen to have a L occipital bleed. Chest x ray significant for Increased right mid and lower lung airspace opacities      SLP Plan  MBS       Recommendations  Diet recommendations: NPO Medication Administration: Via alternative means                Oral Care Recommendations: Oral care QID Follow up Recommendations: Skilled Nursing facility;24 hour supervision/assistance SLP Visit Diagnosis: Dysphagia, unspecified (R13.10) Plan: MBS       GO                Willie Bruce 01/07/2017, 9:15 AM   Willie Bruce, M.A. CCC-SLP [phone]

## 2017-01-07 NOTE — Progress Notes (Signed)
OT Cancellation Note  Patient Details Name: Willie Bruce MRN: [id number] DOB: 1927-01-16   Cancelled Treatment:    Reason Eval/Treat Not Completed: Patient not medically ready (active bedrest orders).  Binnie Kand M.S., OTR/L Pager: [phone]  01/07/2017, 8:06 AM

## 2017-01-08 DIAGNOSIS — I63532 Cerebral infarction due to unspecified occlusion or stenosis of left posterior cerebral artery: Secondary | ICD-10-CM

## 2017-01-08 DIAGNOSIS — R1310 Dysphagia, unspecified: Secondary | ICD-10-CM

## 2017-01-08 LAB — BASIC METABOLIC PANEL
ANION GAP: 9 (ref 5–15)
BUN: 9 mg/dL (ref 6–20)
CALCIUM: 8.9 mg/dL (ref 8.9–10.3)
CO2: 24 mmol/L (ref 22–32)
Chloride: 105 mmol/L (ref 101–111)
Creatinine, Ser: 0.92 mg/dL (ref 0.61–1.24)
Glucose, Bld: 59 mg/dL — ABNORMAL LOW (ref 65–99)
Potassium: 3.6 mmol/L (ref 3.5–5.1)
Sodium: 138 mmol/L (ref 135–145)

## 2017-01-08 LAB — HEPATIC FUNCTION PANEL
ALBUMIN: 3 g/dL — AB (ref 3.5–5.0)
ALK PHOS: 213 U/L — AB (ref 38–126)
ALT: 78 U/L — AB (ref 17–63)
AST: 71 U/L — AB (ref 15–41)
BILIRUBIN DIRECT: 0.3 mg/dL (ref 0.1–0.5)
BILIRUBIN TOTAL: 1.1 mg/dL (ref 0.3–1.2)
Indirect Bilirubin: 0.8 mg/dL (ref 0.3–0.9)
Total Protein: 6.3 g/dL — ABNORMAL LOW (ref 6.5–8.1)

## 2017-01-08 LAB — CBC
HEMATOCRIT: 33.8 % — AB (ref 39.0–52.0)
Hemoglobin: 11 g/dL — ABNORMAL LOW (ref 13.0–17.0)
MCH: 28.9 pg (ref 26.0–34.0)
MCHC: 32.5 g/dL (ref 30.0–36.0)
MCV: 88.9 fL (ref 78.0–100.0)
PLATELETS: 115 10*3/uL — AB (ref 150–400)
RBC: 3.8 MIL/uL — ABNORMAL LOW (ref 4.22–5.81)
RDW: 15.1 % (ref 11.5–15.5)
WBC: 7 10*3/uL (ref 4.0–10.5)

## 2017-01-08 LAB — TRIGLYCERIDES: Triglycerides: 45 mg/dL (ref <150)

## 2017-01-08 LAB — GLUCOSE, CAPILLARY
Glucose-Capillary: 111 mg/dL — ABNORMAL HIGH (ref 65–99)
Glucose-Capillary: 47 mg/dL — ABNORMAL LOW (ref 65–99)
Glucose-Capillary: 103 mg/dL — ABNORMAL HIGH (ref 65–99)

## 2017-01-08 MED ORDER — DEXTROSE-NACL 5-0.9 % IV SOLN
INTRAVENOUS | Status: DC
  Administered 2017-01-08 – 2017-01-09 (×2): via INTRAVENOUS

## 2017-01-08 MED ORDER — LABETALOL HCL 5 MG/ML IV SOLN
10.0000 mg | INTRAVENOUS | Status: DC | PRN
  Administered 2017-01-08: 10 mg via INTRAVENOUS
  Filled 2017-01-08: qty 4

## 2017-01-08 MED ORDER — DEXTROSE 50 % IV SOLN: 25.0000 mL | Freq: Once | INTRAVENOUS | Status: AC

## 2017-01-08 MED ORDER — DEXTROSE 50 % IV SOLN
INTRAVENOUS | Status: AC
  Administered 2017-01-08: 50 mL
  Filled 2017-01-08: qty 50

## 2017-01-08 NOTE — Progress Notes (Signed)
Daily Progress Note   Patient Name: Willie Bruce       Date: 01/08/2017 DOB: 1927-01-03  Age: 83 y.o. MRN#: [id number] Attending Physician: Rosalin Hawking, MD Primary Care Physician: System, Pcp Not In Admit Date: 01/05/2017  Reason for Consultation/Follow-up: Establishing goals of care and Psychosocial/spiritual support  Subjective: Willie Bruce is lethargic and not interacting or following any commands.   Length of Stay: 3  Current Medications: Scheduled Meds:  .  stroke: mapping our early stages of recovery book   Does not apply Once  . insulin aspart  0-9 Units Subcutaneous Q6H  . mouth rinse  15 mL Mouth Rinse BID  . pantoprazole (PROTONIX) IV  40 mg Intravenous QHS    Continuous Infusions: . dextrose 5 % and 0.9% NaCl 75 mL/hr at 01/08/17 1206    PRN Meds: acetaminophen **OR** acetaminophen (TYLENOL) oral liquid 160 mg/5 mL **OR** acetaminophen, haloperidol lactate, labetalol  Physical Exam  Constitutional: He appears well-developed.  HENT:  Head: Normocephalic and atraumatic.  Cardiovascular: Normal rate.   Pulmonary/Chest: Effort normal. No accessory muscle usage. No tachypnea. No respiratory distress.  Abdominal: Soft. Normal appearance.  Neurological: He is disoriented and unresponsive.  Nursing note and vitals reviewed.           Vital Signs: BP (!) 145/69   Pulse 67   Temp 97 F (36.1 C) (Axillary)   Resp (!) 22   Ht 5' 10" (1.778 m)   Wt 63.5 kg (140 lb)   SpO2 95%   BMI 20.09 kg/m  SpO2: SpO2: 95 % O2 Device: O2 Device: Not Delivered O2 Flow Rate:    Intake/output summary:  Intake/Output Summary (Last 24 hours) at 01/08/17 1444 Last data filed at 01/08/17 1300  Gross per 24 hour  Intake           1759.3 ml  Output              860 ml  Net             899.3 ml   LBM: Last BM Date:  (pta) Baseline Weight: Weight: 63.5 kg (140 lb) Most recent weight: Weight: 63.5 kg (140 lb)       Palliative Assessment/Data: 10%   Flowsheet Rows     Most Recent Value  Intake Tab  Referral Department  Neurology  Unit at Time of Referral  ICU  Palliative Care Primary Diagnosis  Neurology  Date Notified  01/05/17  Palliative Care Type  Return patient Palliative Care  Reason for referral  Clarify Goals of Care  Date of Admission  01/05/17  Date first seen by Palliative Care  01/05/17  # of days Palliative referral response time  0 Day(s)  # of days IP prior to Palliative referral  0  Clinical Assessment  Palliative Performance Scale Score  20%  Psychosocial & Spiritual Assessment  Palliative Care Outcomes  Patient/Family meeting held?  Yes  Who was at the meeting?  patient and Dtr Willie Bruce Outcomes  Clarified goals of care      Patient Active Problem List   Diagnosis Date Noted  . Aphasia   . Palliative care by specialist   . Cerebrovascular accident (CVA) due to embolism of left middle cerebral artery ([place])   . 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 ICH (intracerebral hemorrhage) ([place]) 01/05/2017  . Hemorrhagic stroke ([place])   . Metastatic cancer ([place])   . Palliative care encounter   . Goals of care, counseling/discussion   . Protein-calorie malnutrition ([place]) 12/23/2016  . Lung nodules 12/19/2016  . Protein-calorie malnutrition, severe 12/19/2016  . Atypical pneumonia 12/18/2016  . CKD (chronic kidney disease), stage III 12/18/2016  . Normocytic anemia 12/18/2016  . Elevated troponin 12/18/2016  . Type 2 diabetes mellitus with hyperosmolarity without coma, without long-term current use of insulin ([place]) 12/18/2016  . Essential hypertension 12/18/2016  . Coronary artery disease due to lipid rich plaque 12/18/2016  . Weakness 12/17/2016  . Cholangiocarcinoma ([place]) 09/07/2016  . Prostate cancer ([place]) 09/06/2016     Palliative Care Assessment & Plan   HPI: 83 y.o.malewith past medical history of CAD, cholangiocarcinoma bilateral lung nodules as well as lymphatic spread  (dx in Jan in [place]) who was admitted on 6/29/2018with hemorrhagic stroke.  Per initial assessment pt's daughter had indicated no intubation and that conversations in the past he had stated no PEG. He was more alert and indicated otherwise (although his comprehension of these decisions is unclear). Daughter has had more conversation with neurologist and more time to consider their decisions and has requested to speak again with palliative care.    Assessment: I met today with daughter, [name], to better assist with GOC and decision making. [name] tells me that she has had time to gather more information and has decided NOT to pursue artificial feeding at this time. She interprets his agitation with any interaction as a sign that comfort is desired. She understands that with his cancer and now stroke and severe dysphagia that he is at EOL. Discussed prognosis as likely < 10 days with comfort care. We discussed aggressiveness of care including IVF, CBG, medications. She understands that these will not be provided in hospice care and will not add to QOL/comfort. Discussed administration of SL comfort medications in the home. Discussed hospice facility vs hospice at home. She is interested in hospice at home and I have called hospice liaison to assist in answering her questions specific to hospice care. All questions/concerns addressed. Emotional support provided.   Recommendations/Plan:  Continue IVF/CBG until d/c likely to home with hospice.   Comfort is priority - avoid mittens - monitor closely for pulling at foley catheter. Discussed that if this is an issue catheter may be replaced with condom catheter for comfort and safety.   Utilize SL morphine prn for pain/dyspnea  and SL haldol prn agitation/anxiety at home. May utilize in hospice as  needed.   Goals of Care and Additional Recommendations:  Limitations on Scope of Treatment: No Artificial Feeding  Code Status:  DNR  Prognosis:   < 2 weeks  Discharge Planning:  Home with Hospice most likely   Thank you for allowing the Palliative Medicine Team to assist in the care of this patient.   Total Time 36mn Prolonged Time Billed  no       Greater than 50%  of this time was spent counseling and coordinating care related to the above assessment and plan.  AVinie Sill NP Palliative Medicine Team Pager # [phone](M-F 8a-5p) Team Phone # [phone](Nights/Weekends)

## 2017-01-08 NOTE — Progress Notes (Signed)
Patient arrived to unit by bed.  Daughter by bedside.  Patient is nonverbal, resting in bed with eyes closed. Non labored breathing.  No noted distress or discomfort.  Telemetry applied and verified.

## 2017-01-08 NOTE — Progress Notes (Signed)
[facility]  Received request from [facility] for family interest in [facility] services at home after discharge. Appreciate report from PMT NP Alisha. Chart and patient information under review to determine eligibility.  Met with daughter [name] at bedside to confirm interest and initiate hospice education including services, philosophy and team approach to care with good understanding verbalized of information provided. Per discussion, plan is for patient to discharge home via [place] after DME is delivered to home. DME discussed and hospital bed with over bed table to be delivered to home. Daughter [name] is contact for DME. Discharge address has been confirmed as address in EPIC.   Please send OOF DNR with patient. Please send signed scripts for any medication patient does not already have including comfort medications.  Please fax discharge summary to [phone].   [facility] will continue to follow daily until discharge.    Thank you,  Erling Conte, LCSW [phone]

## 2017-01-08 NOTE — Progress Notes (Signed)
PT Cancellation Note  Patient Details Name: Rafe Mackowski MRN: [id number] DOB: Dec 04, 1926   Cancelled Treatment:    Reason Eval/Treat Not Completed: Patient not medically ready Pt continues to be on bedrest. Will await increase in activity orders prior to PT evaluation.   Marguarite Arbour A Raghav Verrilli 01/08/2017, 8:35 AM Wray Kearns, PT, DPT [phone]

## 2017-01-08 NOTE — Progress Notes (Signed)
OT Cancellation Note  Patient Details Name: Willie Bruce MRN: [id number] DOB: 1926-12-27   Cancelled Treatment:    Reason Eval/Treat Not Completed: Patient not medically ready. Pt continues with active bedrest orders. Will check back as able and appropriate for OT evaluation.   [name], MS OTR/L  Pager: [place] A Jazzmyne Rasnick 01/08/2017, 7:27 AM

## 2017-01-08 NOTE — Progress Notes (Signed)
  Speech Language Pathology Treatment: Dysphagia  Patient Details Name: Willie Bruce MRN: [id number] DOB: 07-07-1927 Today's Date: 01/08/2017 Time: 5170-0174 SLP Time Calculation (min) (ACUTE ONLY): 10 min  Assessment / Plan / Recommendation Clinical Impression  Pt is less alert this morning and not accepting of POs despite Max cueing and labial stimulation with various textures and flavors. Given that he made no attempts to take in boluses, POs were not safe to be administered. His daughter was not present this morning during therapy, but per RN she does not seem to want a feeding tube at this time. Should she opt for comfort feeding, I would still be very cautious about administering any POs in his current cognitive state. Instead, I would primarily focus on keeping him comfortable with frequent oral care (particularly since he breathes with an open-mouth posture). Water or thin liquids could be considered if using a swab to moisten his mouth, although I would remove any excess liquid from the swab before applying it to his oral cavity. For now, since pt/family wishes are not clearly defined, would remain NPO.   HPI HPI: Willie Bruce is an 83 y.o. male with hx as below was diagnosed with possible Lung CA last month, placed on pallaitve care service and transferred to rehab to get stronger and transition to hospice care at home once able to leave the rehab. This morning, time remains unclear with rehab facility, he became altered and weaker on the R side, code stroke was called and was transferred to [facility]. On [place] it was seen to have a L occipital bleed. Chest x ray significant for Increased right mid and lower lung airspace opacities      SLP Plan  Continue with current plan of care       Recommendations  Diet recommendations: NPO Medication Administration: Via alternative means                Oral Care Recommendations: Oral care QID Follow up Recommendations: Skilled Nursing facility;24  hour supervision/assistance SLP Visit Diagnosis: Dysphagia, oral phase (R13.11) Plan: Continue with current plan of care       GO                Germain Osgood 01/08/2017, 9:37 AM  Germain Osgood, M.A. CCC-SLP [phone]

## 2017-01-08 NOTE — Progress Notes (Signed)
STROKE TEAM PROGRESS NOTE   SUBJECTIVE (INTERVAL HISTORY) No family is at the bedside.  Patient did not pass swallow yesterday. Currently still nothing by mouth. Dr. stated that she does not want feeding tube for her father. Will need PEG care discussion with family regarding goal of care.    OBJECTIVE Temp:  [97 F (36.1 C)-99.1 F (37.3 C)] 97 F (36.1 C) (07/02 0800) Pulse Rate:  [41-124] 87 (07/02 1200) Cardiac Rhythm: Normal sinus rhythm (07/02 0800) Resp:  [17-31] 19 (07/02 1200) BP: (113-177)/(44-124) 142/73 (07/02 1200) SpO2:  [91 %-100 %] 96 % (07/02 1200)  CBC:  Recent Labs Lab 01/05/17 0724  01/07/17 0240 01/08/17 0221  WBC 5.8  < > 8.7 7.0  NEUTROABS 4.4  --   --   --   HGB 10.3*  < > 10.6* 11.0*  HCT 32.8*  < > 33.4* 33.8*  MCV 91.4  < > 90.0 88.9  PLT 99*  < > 137* 115*  < > = values in this interval not displayed.  Basic Metabolic Panel:   Recent Labs Lab 01/07/17 0240 01/08/17 0221  NA 138 138  K 4.0 3.6  CL 103 105  CO2 25 24  GLUCOSE 115* 59*  BUN 9 9  CREATININE 0.88 0.92  CALCIUM 9.4 8.9    Lipid Panel:     Component Value Date/Time   CHOL 125 01/07/2017 0240   TRIG 45 01/08/2017 0221   HDL 57 01/07/2017 0240   CHOLHDL 2.2 01/07/2017 0240   VLDL 15 01/07/2017 0240   LDLCALC 53 01/07/2017 0240   HgbA1c:  Lab Results  Component Value Date   HGBA1C 8.5 (H) 12/18/2016   Urine Drug Screen: No results found for: LABOPIA, COCAINSCRNUR, LABBENZ, AMPHETMU, THCU, LABBARB  Alcohol Level No results found for: [place] I have personally reviewed the radiological images below and agree with the radiology interpretations.  Chest Port 1 View 01/05/2017 Increased right mid and lower lung airspace opacities.   Ct Head Code Stroke W/o Cm 01/05/2017 1. Acute/early subacute hemorrhagic infarction in the occipital lobe. No extra-axial blood. This may be 78-55 days old.  2. ASPECTS is 9   Ct Head Wo Contrast 01/06/2017 IMPRESSION: Stable  appearance of hemorrhagic infarct LEFT occipital lobe. Newly visualized non hemorrhagic infarct LEFT parietal lobe adjacent to sylvian fissure.   TTE - Left ventricle: The cavity size was normal. Wall thickness was   increased in a pattern of mild LVH. Systolic function was   vigorous. The estimated ejection fraction was in the range of 65%   to 70%. Wall motion was normal; there were no regional wall   motion abnormalities. Doppler parameters are consistent with   abnormal left ventricular relaxation (grade 1 diastolic   dysfunction). The E/e&' ratio is between 8-15, suggesting   indeterminate LV filling pressure. - Aortic valve: Sclerosis without stenosis. There was trivial   regurgitation. - Left atrium: The atrium was normal in size. - Inferior vena cava: The vessel was normal in size. The   respirophasic diameter changes were in the normal range (>= 50%),   consistent with normal central venous pressure.   PHYSICAL EXAM  Temp:  [97 F (36.1 C)-99.1 F (37.3 C)] 97 F (36.1 C) (07/02 0800) Pulse Rate:  [41-124] 87 (07/02 1200) Resp:  [17-31] 19 (07/02 1200) BP: (113-177)/(44-124) 142/73 (07/02 1200) SpO2:  [91 %-100 %] 96 % (07/02 1200)  General - Well nourished, well developed, in no apparent distress.  Ophthalmologic -  Fundi not visualized due to noncooperation.  Cardiovascular - Regular rate and rhythm.  Neuro - lethargic, but arousable, no speech output, not able to follow commands. B/l pupil 1.5cm, sluggish to light reaction. Eyes left gaze preference, but able to cross midline, right hemianopia, not blinking to visual threat. Left visual field grossly intact. Right facial droop, tongue in midline inside mouth. LUE spontaneous purposeful movement against gravity. RUE 0/5 even no movement with pain stimulation. LLE 4/5 and RLE 3-/5. DTR 1+ and no babinski. Sensation, coordination and gait not tested.   ASSESSMENT/PLAN [name] is a 83 y.o. male with history of  hypertension, hyperlipidemia, diabetes mellitus, coronary artery disease, and lung CA (palliative care) presenting with altered mental status and right-sided weakness. He did not receive IV t-PA due to [place].  Stroke with hemorrhagic transformation: acute/early subacute hemorrhagic infarction in the occipital lobe, and left MCA ischemic stroke. 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  CT head repeat - left occipital acute/subacute hemorrhagic infarct and left MCA acute infarct  Hold off other stroke work up per daughter request  2D Echo EF 65-70%  LDL - 53  HgbA1c - 8.5  VTE prophylaxis - SCDs Diet NPO time specified  aspirin 81 mg daily prior to admission, now on No antithrombotic secondary to hemorrhagic infarct.   Ongoing aggressive stroke risk factor management  Therapy recommendations: pending  Disposition: Palate care to meet patient daughter this afternoon for GOC  Dysphagia   Failed Swallow and MBS  NPO   Daughter will meet her care team regarding [place]  Metastatic extrahepatic cholangiocarcinoma  Diagnosed in 07/2016 in [place]  Untreated  CT, MRCP, ERCP reviewed a 7 x 6 x 6 mm mass in the distal common bile duct, positive for adenocarcinoma, peripancreatic lymph node biopsy also showed metastatic adenocarcinoma.   S/p CBD stent placement.   F/u with [facility] and also had second opinion from Dr. Burr Medico in [facility]  Recommended palliative care and home hospice.  Hypertension  Stable   On Cleviprex IV  BP goal < 160  Hyperlipidemia  Home meds:  Lipitor 10 mg daily not resumed  LDL - 38  Hold off statin due to elevated LFT  LFT improving  Diabetes  Hb A1c - 8.5 on  12/18/2016  Uncontrolled  Hyperglycemia  SSI  However, became hypoglycemia overnight, received D50, insulin discontinued, change IV fluid to D5NS  Other Stroke Risk Factors  Advanced age  Coronary artery disease  Other Active Problems  Prostate cancer - in remission  Anemia of chronic disease - 10.2 -> 10.6->11.0  Thrombocytopenia - 99 -> 123 ->137->115  Traumatic Foley placement -> hematuria, improved  Hospital day # 3  This patient is critically ill due to hemorrhagic infarct, stroke, malignancy, hypertensive emergency and at significant risk of neurological worsening, death form recurrent stroke, hemorrhage, heart failure, seizure. This patient's care requires constant monitoring of vital signs, hemodynamics, respiratory and cardiac monitoring, review of multiple databases, neurological assessment, discussion with family, other specialists and medical decision making of high complexity. I spent 35 minutes of neurocritical care time in the care of this patient. I had long discussion with daughter at bedside, updated pt current condition, treatment plan and potential prognosis. She expressed understanding and appreciation.   Willie Hawking, MD PhD Stroke Neurology 01/08/2017  12:14 PM   To contact Stroke Continuity provider, please refer to [email or web address] After hours, contact General Neurology

## 2017-01-09 DIAGNOSIS — R1312 Dysphagia, oropharyngeal phase: Secondary | ICD-10-CM

## 2017-01-09 LAB — GLUCOSE, CAPILLARY
GLUCOSE-CAPILLARY: 138 mg/dL — AB (ref 65–99)
GLUCOSE-CAPILLARY: 196 mg/dL — AB (ref 65–99)
Glucose-Capillary: 239 mg/dL — ABNORMAL HIGH (ref 65–99)

## 2017-01-09 MED ORDER — MORPHINE SULFATE (CONCENTRATE) 10 MG/0.5ML PO SOLN: 5.0000 mg | ORAL | 0 refills | Status: AC | PRN

## 2017-01-09 MED ORDER — ACETAMINOPHEN 650 MG RE SUPP: 650.0000 mg | RECTAL | 0 refills | Status: AC | PRN

## 2017-01-09 MED ORDER — LORAZEPAM 2 MG/ML PO CONC: 0.5000 mg | ORAL | Status: DC | PRN

## 2017-01-09 MED ORDER — MORPHINE SULFATE (CONCENTRATE) 10 MG/0.5ML PO SOLN
5.0000 mg | ORAL | Status: DC | PRN
Start: 2017-01-09 — End: 2017-01-09

## 2017-01-09 MED ORDER — LORAZEPAM 2 MG/ML PO CONC
1.0000 mg | ORAL | 0 refills | Status: AC | PRN
Start: 2017-01-09 — End: ?

## 2017-01-09 MED ORDER — LORAZEPAM 2 MG/ML PO CONC
0.5000 mg | ORAL | Status: DC | PRN
Start: 2017-01-09 — End: 2017-01-09

## 2017-01-09 NOTE — Discharge Summary (Signed)
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 Cancer ([place])   . Coronary artery disease   . Diabetes mellitus without complication ([place])   . High cholesterol   . Hypertension    Past Surgical History:  Procedure Laterality Date  . CORONARY ARTERY BYPASS GRAFT      Allergies as of 01/09/2017   No Known Allergies     Medication List    STOP taking these medications   aspirin EC 81 MG tablet   atorvastatin 10 MG tablet Commonly known as:  LIPITOR   bicalutamide 50 MG tablet Commonly known as:  CASODEX   feeding supplement (GLUCERNA SHAKE) Liqd   feeding supplement (PRO-STAT SUGAR FREE 64) Liqd   ferrous sulfate 325 (65 FE) MG tablet   gabapentin 100 MG capsule Commonly known as:  NEURONTIN   HUMALOG KWIKPEN 100 UNIT/ML KiwkPen Generic drug:  insulin lispro   LANTUS SOLOSTAR 100 UNIT/ML Solostar Pen Generic drug:  Insulin Glargine   meclizine 25 MG tablet Commonly known as:  ANTIVERT   meloxicam 7.5 MG tablet Commonly known as:  MOBIC   metoprolol tartrate 100 MG tablet Commonly known as:  LOPRESSOR   repaglinide 1 MG tablet Commonly known as:  PRANDIN   tamsulosin 0.4 MG Caps capsule Commonly known as:  FLOMAX     TAKE these medications    acetaminophen 650 MG suppository Commonly known as:  TYLENOL Place 1 suppository (650 mg total) rectally every 4 (four) hours as needed for mild pain (or temp > 37.5 C (99.5 F)).   LORazepam 2 MG/ML concentrated solution Commonly known as:  ATIVAN Place 0.5 mLs (1 mg total) under the tongue every 3 (three) hours as needed (ANXIETY, AGITATION).   morphine CONCENTRATE 10 MG/0.5ML Soln concentrated solution Place 0.25 mLs (5 mg total) under the tongue every hour as needed (PAIN, SHORTNESS OF BREATH).       LABORATORY STUDIES CBC    Component Value Date/Time   WBC 7.0 01/08/2017 0221   RBC 3.80 (L) 01/08/2017 0221   HGB 11.0 (L) 01/08/2017 0221   HCT 33.8 (L) 01/08/2017 0221   PLT 115 (L) 01/08/2017 0221   MCV 88.9 01/08/2017 0221   MCH 28.9 01/08/2017 0221   MCHC 32.5 01/08/2017 0221   RDW 15.1 01/08/2017 0221   LYMPHSABS 0.7 01/05/2017 0724   MONOABS 0.4 01/05/2017 0724   EOSABS 0.2 01/05/2017 0724   BASOSABS 0.0 01/05/2017 0724   CMP    Component Value Date/Time   NA 138 01/08/2017 0221   NA 135 (A) 12/25/2016   K 3.6 01/08/2017 0221   CL 105 01/08/2017 0221   CO2 24 01/08/2017 0221   GLUCOSE 59 (L) 01/08/2017 0221  BUN 9 01/08/2017 0221   BUN 20 12/25/2016   CREATININE 0.92 01/08/2017 0221   CALCIUM 8.9 01/08/2017 0221   PROT 6.3 (L) 01/08/2017 0221   ALBUMIN 3.0 (L) 01/08/2017 0221   AST 71 (H) 01/08/2017 0221   ALT 78 (H) 01/08/2017 0221   ALKPHOS 213 (H) 01/08/2017 0221   BILITOT 1.1 01/08/2017 0221   GFRNONAA >60 01/08/2017 0221   GFRAA >60 01/08/2017 0221   COAGS Lab Results  Component Value Date   INR 1.23 01/05/2017   Lipid Panel    Component Value Date/Time   CHOL 125 01/07/2017 0240   TRIG 45 01/08/2017 0221   HDL 57 01/07/2017 0240   CHOLHDL 2.2 01/07/2017 0240   VLDL 15 01/07/2017 0240   LDLCALC 53 01/07/2017 0240   HgbA1C  Lab Results  Component Value Date   HGBA1C 8.5 (H) 12/18/2016   Urinalysis    Component Value Date/Time    COLORURINE RED (A) 01/06/2017 1105   APPEARANCEUR CLOUDY (A) 01/06/2017 1105   LABSPEC 1.016 01/06/2017 1105   PHURINE 5.0 01/06/2017 1105   GLUCOSEU >=500 (A) 01/06/2017 1105   HGBUR LARGE (A) 01/06/2017 1105   BILIRUBINUR NEGATIVE 01/06/2017 1105   KETONESUR NEGATIVE 01/06/2017 1105   PROTEINUR 100 (A) 01/06/2017 1105   NITRITE NEGATIVE 01/06/2017 1105   LEUKOCYTESUR NEGATIVE 01/06/2017 1105   Urine Drug Screen No results found for: LABOPIA, COCAINSCRNUR, LABBENZ, AMPHETMU, THCU, LABBARB  Alcohol Level No results found for: [place] I have personally reviewed the radiological images below and agree with the radiology interpretations.  Chest Port 1 View 01/05/2017 Increased right mid and lower lung airspace opacities.   Ct Head Code Stroke W/o Cm 01/05/2017 1. Acute/early subacute hemorrhagic infarction in the occipital lobe. No extra-axial blood. This may be 30-2 days old.  2. ASPECTS is 9   Ct Head Wo Contrast 01/06/2017 IMPRESSION: Stable appearance of hemorrhagic infarct LEFT occipital lobe. Newly visualized non hemorrhagic infarct LEFT parietal lobe adjacent to sylvian fissure.   TTE - Left ventricle: The cavity size was normal. Wall thickness was increased in a pattern of mild LVH. Systolic function was vigorous. The estimated ejection fraction was in the range of 65% to 70%. Wall motion was normal; there were no regional wall motion abnormalities. Doppler parameters are consistent with abnormal left ventricular relaxation (grade 1 diastolic dysfunction). The E/e&' ratio is between 8-15, suggesting indeterminate LV filling pressure. - Aortic valve: Sclerosis without stenosis. There was trivial regurgitation. - Left atrium: The atrium was normal in size. - Inferior vena cava: The vessel was normal in size. The respirophasic diameter changes were in the normal range (>= 50%), consistent with normal central venous  pressure.    HISTORY OF PRESENT ILLNESS Willie Bruce is an 83 y.o. male with hx as below was diagnosed with possible Lung CA last month, placed on pallaitve care service and transferred to rehab to get stronger and transition to hospice care at home once able to leave the rehab. This morning, time remains unclear with rehab facility, he became altered and weaker on the R side, code stroke was called and was transferred to [facility]. On [place] it was seen to have a L occipital bleed. [place] 1 NIHSS 11   HOSPITAL COURSE Mr. Tiago Humphrey is a 83 y.o. 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 He did not receive IV  t-PA due to [place].  Stroke with hemorrhagic transformation: acute/early subacute hemorrhagic infarction in the occipital lobe, and left MCA ischemic stroke. 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  CT head repeat - left occipital acute/subacute hemorrhagic infarct and left MCA acute infarct  Hold off other stroke work up per daughter request  2D Echo EF 65-70%  LDL - 53  HgbA1c - 8.5  VTE prophylaxis - SCDs  Diet NPO time specified  aspirin 81 mg daily prior to admission, now on no antithrombotic secondary to hemorrhagic infarct and home hospice.   Ongoing aggressive stroke risk factor management  Therapy recommendations: pending  Disposition: home hospice   Dysphagia   Failed Swallow and MBS  NPO   Further diet status will be decide with home hospice nurse  Metastatic extrahepatic cholangiocarcinoma  Diagnosed in 07/2016 in [place]  Untreated  CT, MRCP, ERCP reviewed a  7 x 6 x 6 mm mass in the distal common bile duct, positive for adenocarcinoma, peripancreatic lymph node biopsy also showed metastatic adenocarcinoma.   S/p CBD stent placement.   F/u with [facility] and also had second opinion from Dr. Burr Medico in [facility]  Daughter decided home hospice.  Hypertension  Stable   BP goal < 160  Hyperlipidemia  Home meds:  Lipitor 10 mg daily not resumed  LDL - 53  Hold off statin due to elevated LFT and home hospice  LFT improving  Diabetes  Hb A1c - 8.5 on 12/18/2016  Uncontrolled  Hyperglycemia with hypoglycemia  SSI  Had hypoglycemia episode, received D50, insulin discontinued, change IV fluid to D5NS  Other Stroke Risk Factors  Advanced age  Coronary artery disease  Other Active Problems  Prostate cancer - in remission  Anemia of chronic disease - 10.2 -> 10.6->11.0  Thrombocytopenia - 99 -> 123 ->137->115  Traumatic Foley placement -> hematuria, improved  DISCHARGE EXAM Blood pressure (!) 141/61, pulse 90, temperature 98 F (36.7 C), temperature source Axillary, resp. rate 19, height 5\' 10"  (1.778 m), weight 140 lb (63.5 kg), SpO2 97 %.  General - Well nourished, well developed, in no apparent distress.  Ophthalmologic - Fundi not visualized due to noncooperation.  Cardiovascular - Regular rate and rhythm.  Neuro - lethargic, but arousable, no speech output, not able to follow commands. B/l pupil 2cm, reactive to light. Eyes left gaze preference, but able to cross midline, right hemianopia, not blinking to visual threat. Left visual field grossly intact. Right facial droop, tongue in midline inside mouth. LUE spontaneous purposeful movement against gravity. RUE 0/5 even no movement with pain stimulation. LLE 4/5 and RLE 3-/5. DTR 1+ and no babinski. Sensation, coordination and gait not tested.  Discharge Diet   Diet NPO pending home hospice nurse once arrive home  DISCHARGE PLAN  Disposition:  Home  hospice  No antithrombotic for secondary stroke prevention for home hospice.  Morphine, tylenol, ativan PRN for pain, agitation.   Follow-up home hospice / palliative care   40 minutes were spent preparing discharge.  Rosalin Hawking, MD PhD Stroke Neurology 01/09/2017 4:31 PM

## 2017-01-09 NOTE — Progress Notes (Signed)
SLP Cancellation Note  Patient Details Name: Willie Bruce MRN: [id number] DOB: [birth year]   Cancelled treatment:       Reason Eval/Treat Not Completed: Other (comment) Per chart review, note that pt's daughter has decided to pursue comfort measures with the plan for him to return home today with hospice. Willie Bruce was not present for training and Mr. Susman was soundly sleeping upon my arrival, therefore will defer swallow treatment for now. Please contact SLP if she has any questions.   Germain Osgood 01/09/2017, 3:54 PM   Germain Osgood, M.A. CCC-SLP [phone]

## 2017-01-09 NOTE — Progress Notes (Signed)
Pt discharged home via PTAR. Patient's daughter [name] is at home and awaits his arrival.

## 2017-01-09 NOTE — Care Management Note (Signed)
Case Management Note  Patient Details  Name: Willie Bruce MRN: [id number] Date of Birth: Sep 20, 1926  Subjective/Objective:                    Action/Plan: Patient discharging home with hospice today. CM received phone call from daughter, [name], that the equipment had arrived at the home. CM notified Dr Erlinda Hong who did DNR and prescriptions.  CM notified Harmon Pier with [facility]. And called [facility] for transportation home. CM notified Kim of time frame for [facility] that CM received.  Transportation form on the front of the chart. Bedside RN updated.    Expected Discharge Date:  01/09/17               Expected Discharge Plan:  Home w Hospice Care  In-House Referral:     Discharge planning Services  CM Consult  Post Acute Care Choice:  Hospice Choice offered to:  Adult Children  DME Arranged:  Hospital bed, Overbed table, 3-N-1 DME Agency:  [place]:    [place] (hospice making the arrangements)  Status of Service:  Completed, signed off  If discussed at [place] of Stay Meetings, dates discussed:    Additional Comments:  Pollie Friar, RN 01/09/2017, 4:47 PM

## 2017-01-09 NOTE — Progress Notes (Signed)
Palliative:  Willie Bruce is resting comfortably. I called and spoke with daughter, Willie Bruce, and she is preparing home to be ready for her father and awaiting equipment delivery. She has no further questions/concerns at this time. Plan is for home with hospice today.   No charge   Vinie Sill, NP Palliative Medicine Team Pager # [phone] (M-F 8a-5p) Team Phone # [phone] (Nights/Weekends)

## 2017-01-09 NOTE — Progress Notes (Signed)
[facility] Liaison   Spoke with daughter [name], equipment to be delivered by [facility] [place] [facility] between 3-7 PM today.   Hospice Admission nurse is scheduled to see patient in home around [place] will follow up with daughter, have daughter contact her when equipment has been delivered.  Please call if any Hospice related questions.  Farrel Gordon RN [facility] Liaison  [phone]  All Hospital Liaisons are now on AMION.

## 2017-01-09 NOTE — Progress Notes (Signed)
PT Cancellation Note  Patient Details Name: Willie Bruce MRN: [id number] DOB: 1926-11-09   Cancelled Treatment:     per chart, appears plan is for hospice and comfort measures a this time, no acute PT needs. Will sign off.   Duncan Dull 01/09/2017, 7:51 AM  Alben Deeds, PT DPT NCS [phone]

## 2017-01-09 NOTE — Progress Notes (Signed)
OT Cancellation Note  Patient Details Name: Willie Bruce MRN: [id number] DOB: 1926/10/11   Cancelled Treatment:    Reason Eval/Treat Not Completed: Other (comment). Pt now with comfort goals of care and plan to D/C home with hospice. OT will sign off at this time. Please re-consult if needs change.   [name], MS OTR/L  Pager: [place] [name] 01/09/2017, 7:51 AM

## 2017-01-09 NOTE — Care Management Note (Signed)
Case Management Note  Patient Details  Name: Willie Bruce MRN: [id number] Date of Birth: [birth year]  Subjective/Objective:     Pt in with [facility]. He is from home with his daughter.                Action/Plan: Plan is to discharge home with hospice care. Palliative spoke to family yesterday and they wanted to use [facility]. CM informed Harmon Pier with [facility] and she met with the family yesterday.  CM spoke with patients daughter "[name]" over the phone this am and asked that she notify CM when equipment is delivered to the home. CM will arrange [facility] transport home when equipment is in the home. Scheduled time for delivery is 3-7pm with a hospice visit between 7-8 pm.  CM following.  Expected Discharge Date:                  Expected Discharge Plan:  Home w Hospice Care  In-House Referral:     Discharge planning Services  CM Consult  Post Acute Care Choice:  Hospice Choice offered to:  Adult Children  DME Arranged:  Hospital bed, Overbed table DME Agency:     HH Arranged:    [place] Agency:   (hospice making the arrangements)  Status of Service:  In process, will continue to follow  If discussed at [place] of Stay Meetings, dates discussed:    Additional Comments:  Pollie Friar, RN 01/09/2017, 10:45 AM

## 2017-01-09 NOTE — Care Management Important Message (Signed)
Important Message  Patient Details  Name: Willie Bruce MRN: [id number] Date of Birth: 04-Nov-1926   Medicare Important Message Given:  Yes    [name] Montine Circle 01/09/2017, 12:54 PM

## 2017-02-07 DEATH — deceased

## 2018-08-20 IMAGING — CR DG CHEST 2V
2 series · 2 of 2 positions shown · non-contrast
Comparison: None.

CLINICAL DATA: Exertional shortness breath for several days.

EXAM:
CHEST  2 VIEW

[w chest pa]
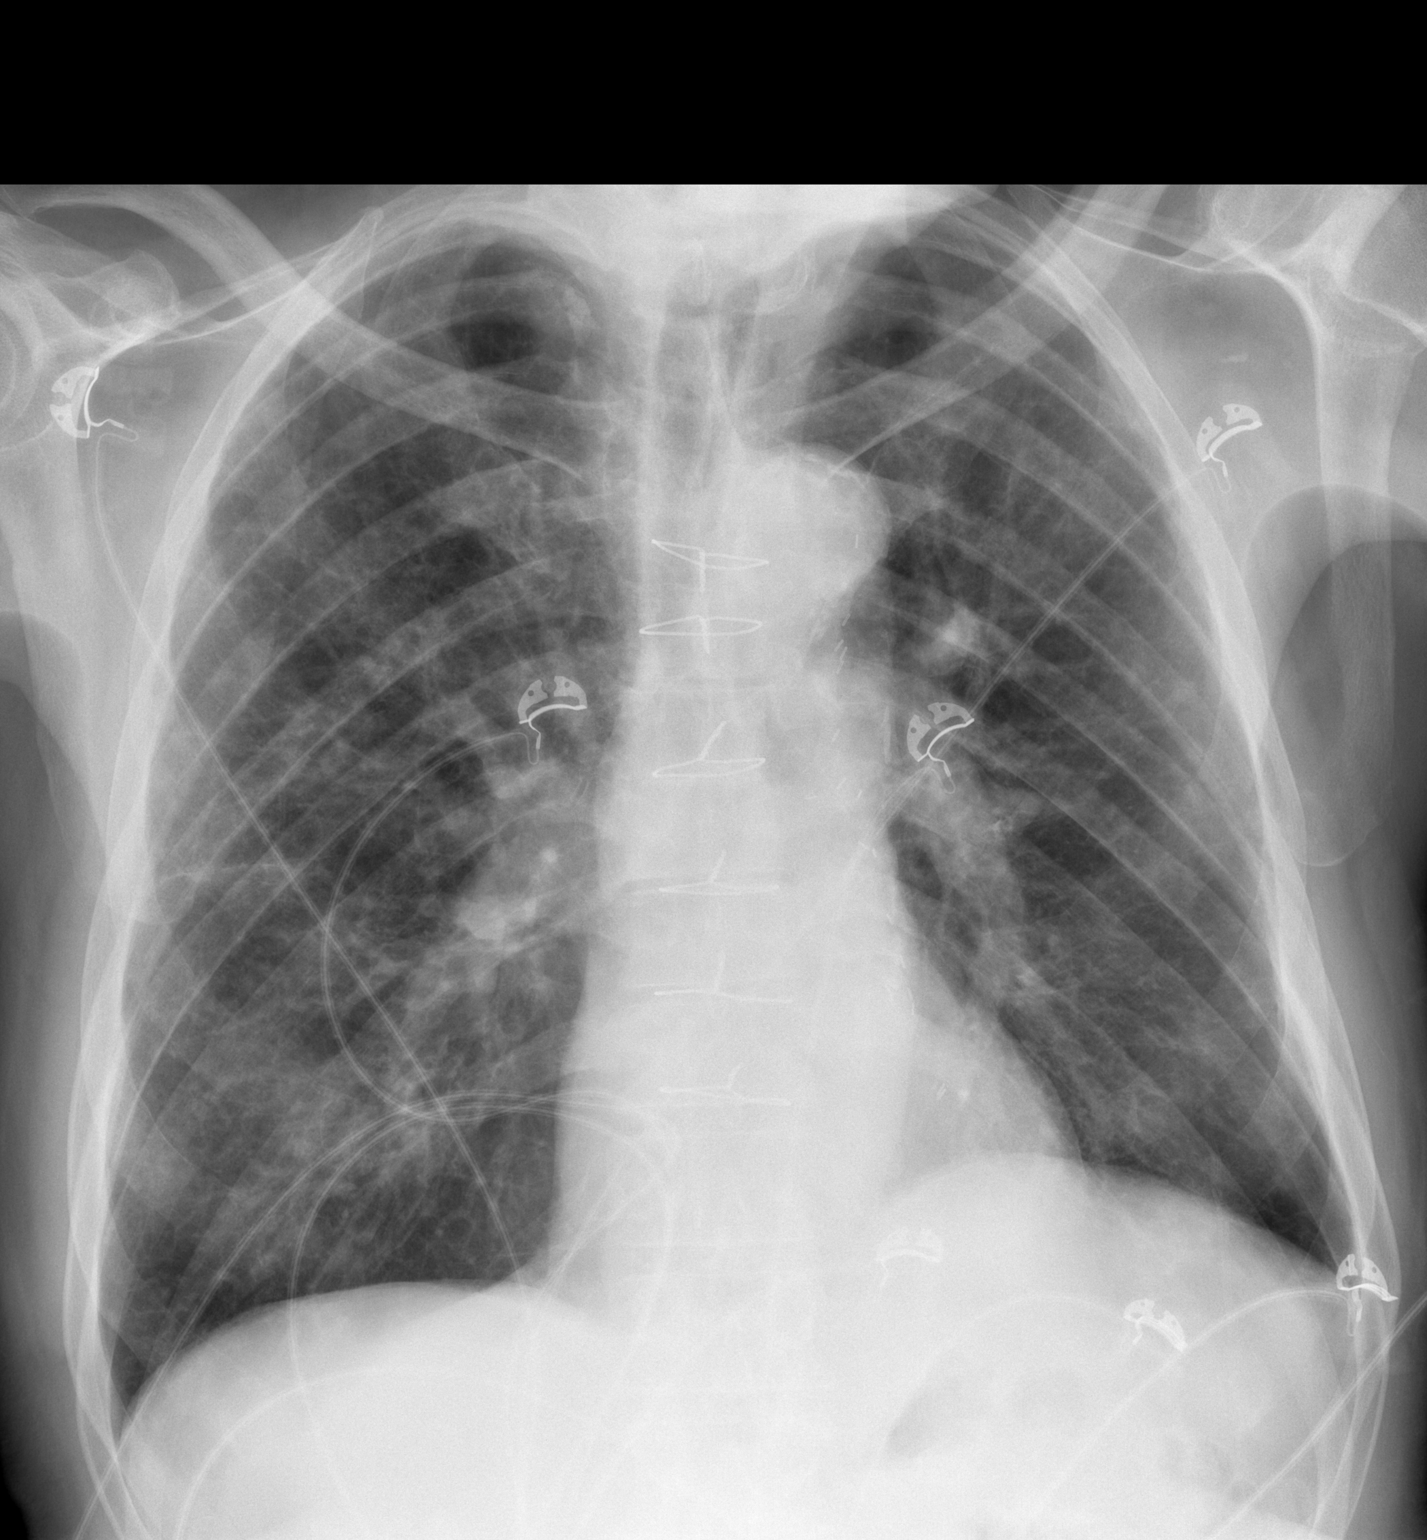

[w chest lat]
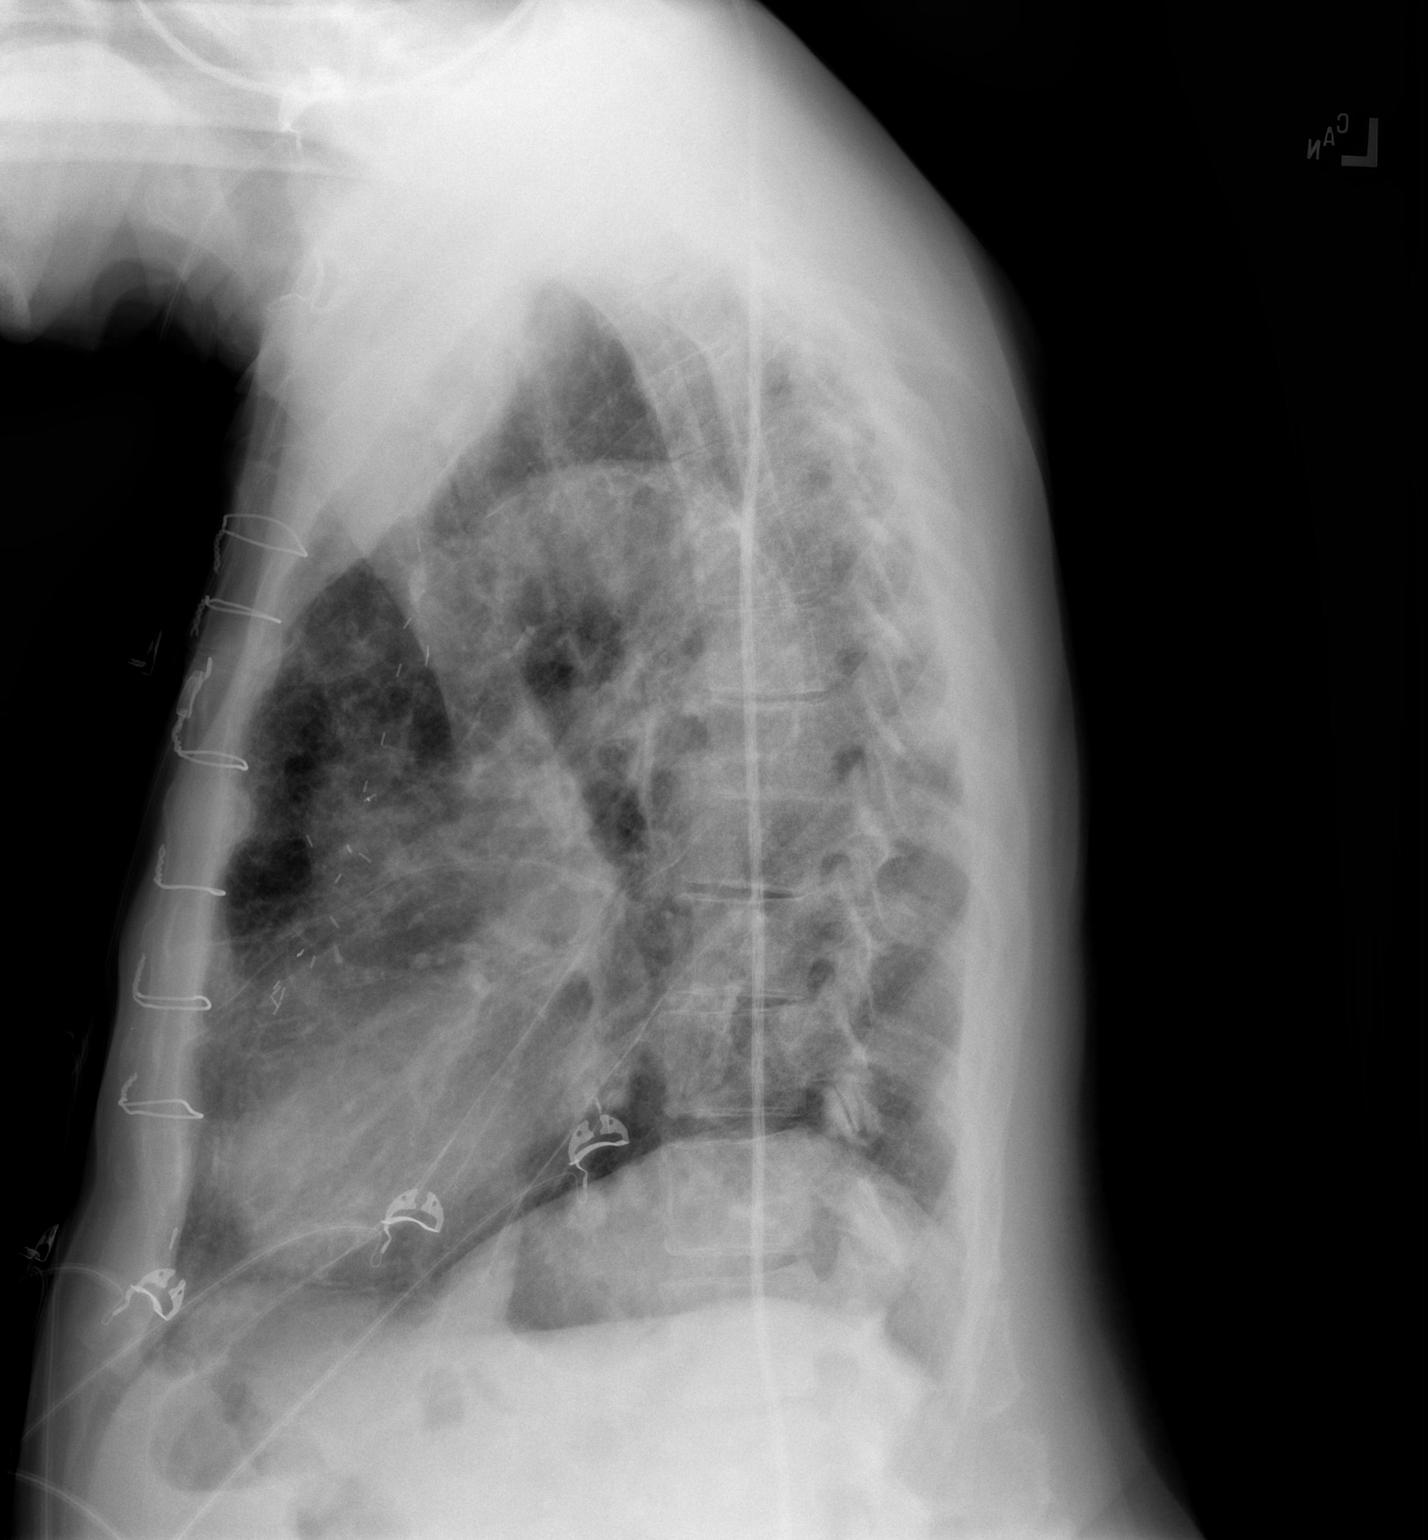

[2 of 2 positions shown; findings below may reference images not displayed]

FINDINGS: Normal sized heart. Post CABG changes. Mild patchy opacities
scattered throughout the right lung.a mall poorly defined nodular
opacity in the left mid lung zone. Unremarkable bones.
IMPRESSION: Mild patchy opacities in the right lung and small poorly defined
nodular opacity in the left mid lung zone. These could represent
infectious or post infectious changes. A chest CT with contrast may
provide useful additional information.

## 2018-08-20 IMAGING — CT CT ABD-PELV W/ CM
2 of 5 series · 14 of 46 positions shown, 16 images · IV contrast (APPLIED)
Comparison: None.

CLINICAL DATA: Exertional shortness of breath, history of recently
diagnosed cholangiocarcinoma

EXAM:
CT ABDOMEN AND PELVIS WITH CONTRAST
TECHNIQUE: Multidetector CT imaging of the abdomen and pelvis was performed
using the standard protocol following bolus administration of
intravenous contrast.
CONTRAST:  100mL WSUUKJ-SBB IOPAMIDOL (WSUUKJ-SBB) INJECTION 61%

[Series 2: axial st · axial · 0.80mm/px · z∈[-661,-291]mm · 11 of 84 slices shown, 13 images]
[im 5/84  soft-tissue]
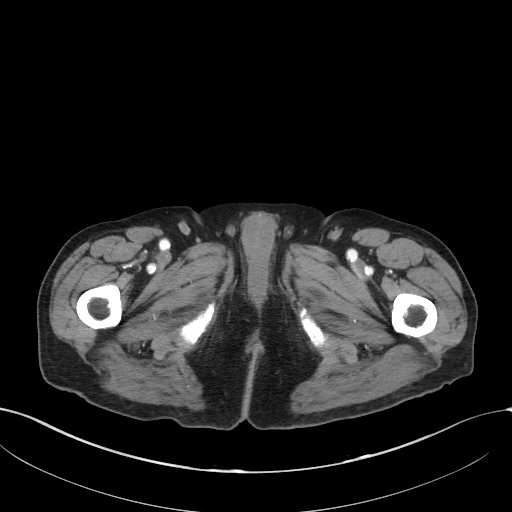
[im 5/84  bone]
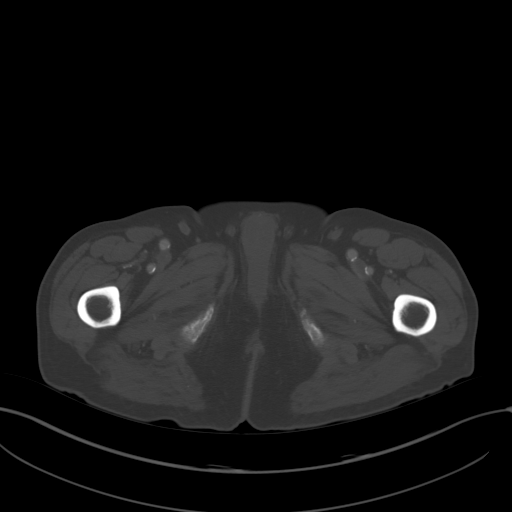
[im 15/84  soft-tissue]
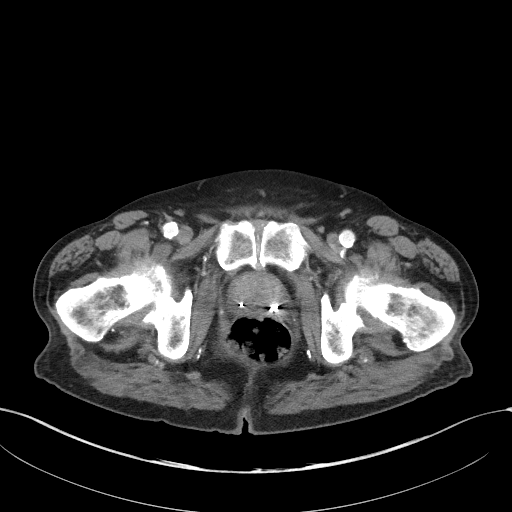
[im 20/84  soft-tissue]
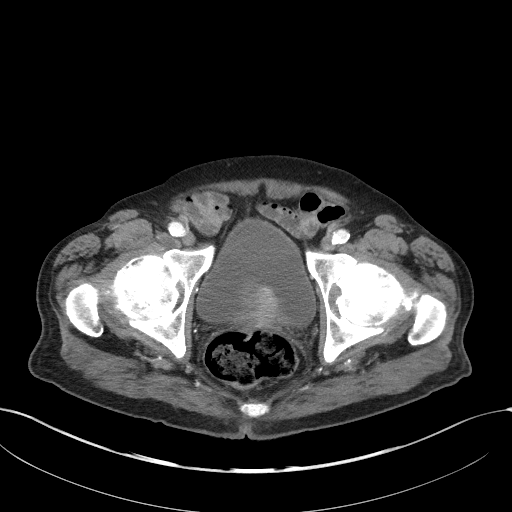
[im 30/84  soft-tissue]
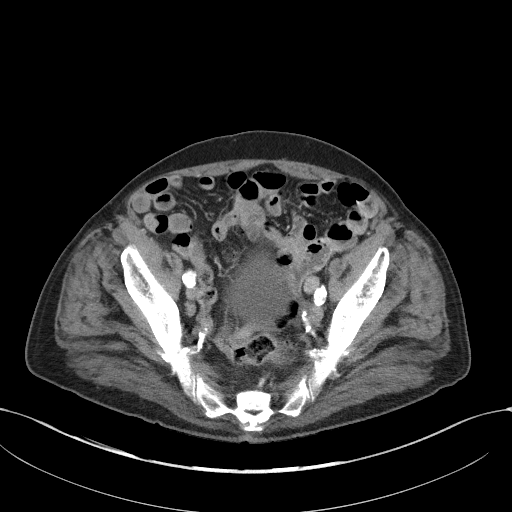
[im 35/84  soft-tissue]
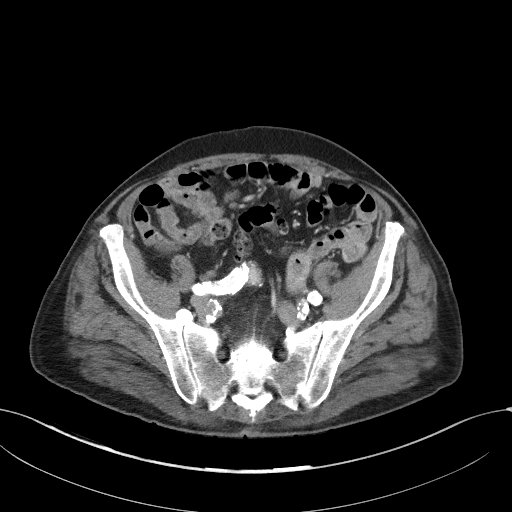
[im 44/84  soft-tissue]
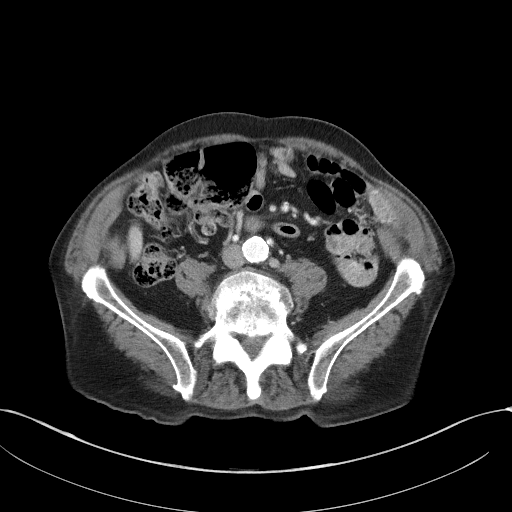
[im 49/84  soft-tissue]
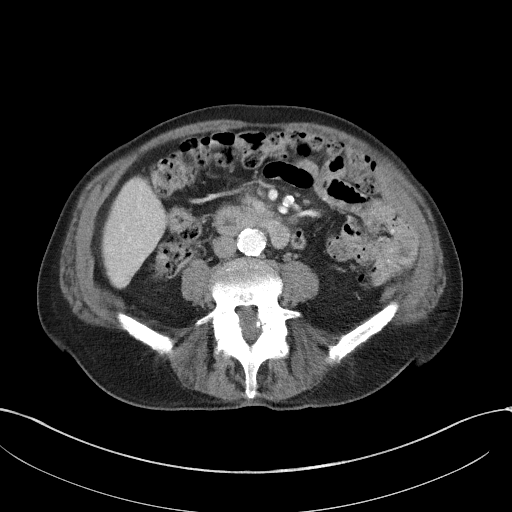
[im 54/84  soft-tissue]
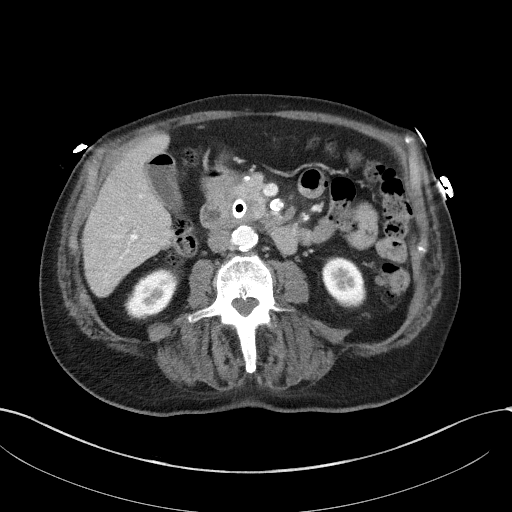
[im 64/84  soft-tissue]
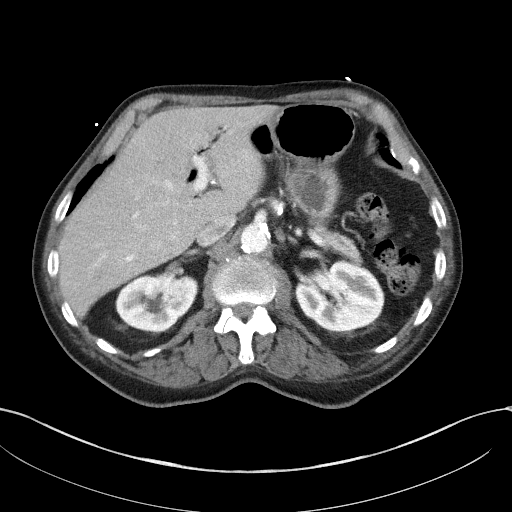
[im 64/84  bone]
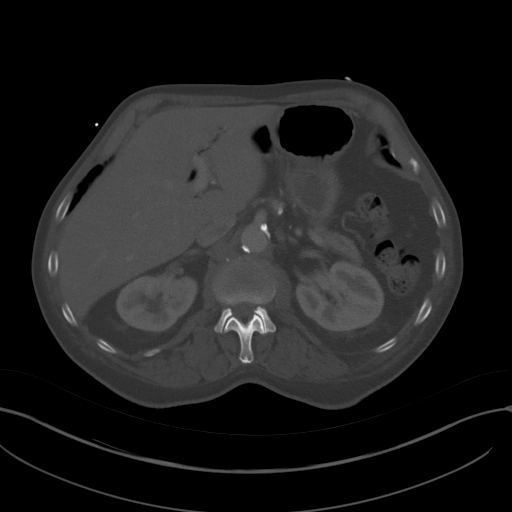
[im 69/84  soft-tissue]
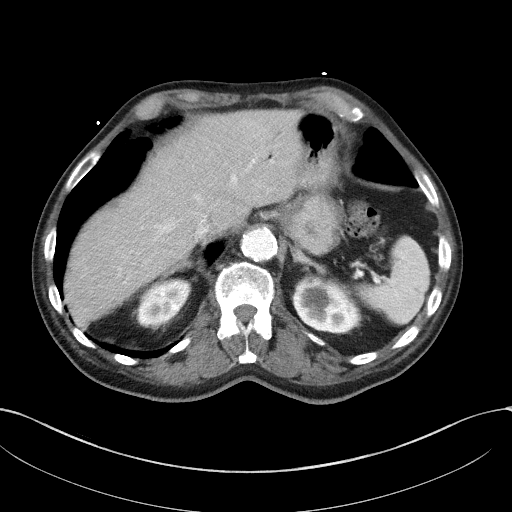
[im 79/84  soft-tissue]
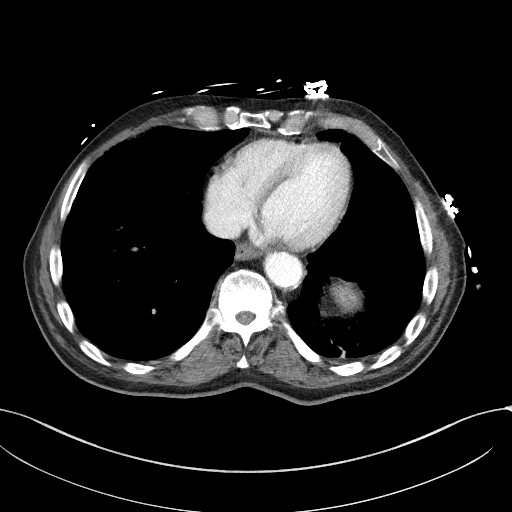

[Series 5: coronal st · coronal · 0.67mm/px · 3 of 101 slices shown]
[im 34/101  soft-tissue]
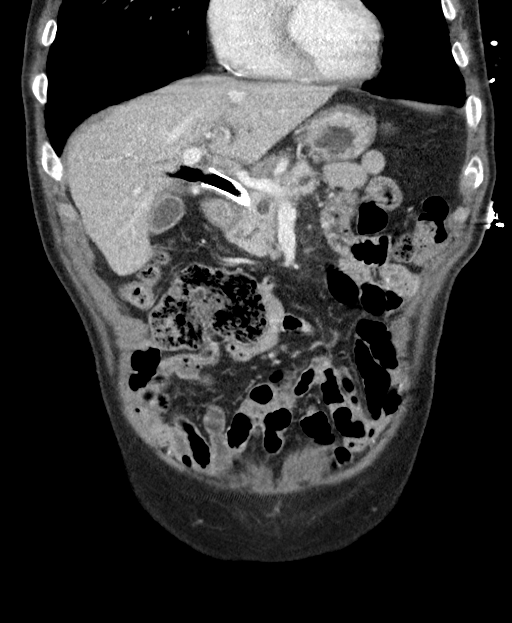
[im 45/101  soft-tissue]
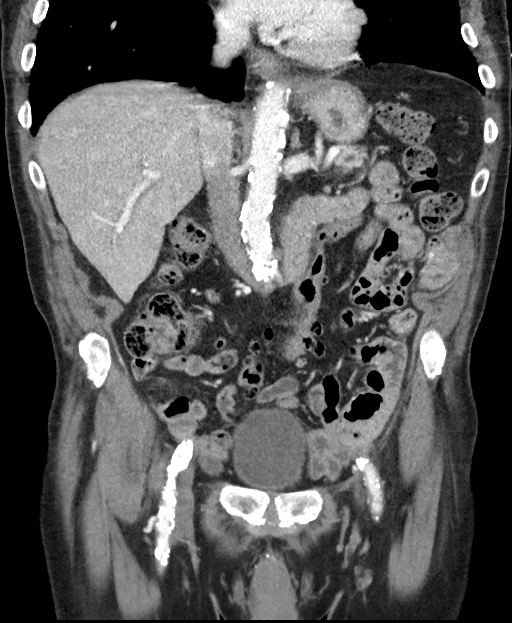
[im 56/101  soft-tissue]
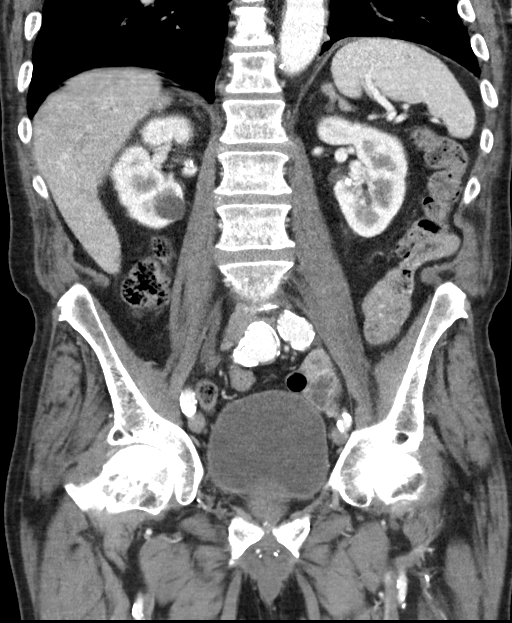

[14 of 46 positions shown; findings below may reference images not displayed]

FINDINGS: Lower chest: Lung bases demonstrate multiple, greater than 6,
peripherally based nodules within the right lower lobe, right middle
lobe, lingula and left lower lobe. No pleural effusion or focal
consolidation is seen. The heart is nonenlarged. Coronary artery
calcifications.

Hepatobiliary: Vague hypodense somewhat branching appearing
structures in the right hepatic lobe. No focal mass is seen.
Pneumobilia a with presence of biliary stent. Fluid or debris in the
distal portion of the stent at the duodenum. Small air in the
gallbladder. No calcified stones. Ill-defined soft tissue
thickening/hypodensity at the distal portion of biliary stent

Pancreas: Pancreatic duct is enlarged. Slight bloody appearance at
the head of pancreas and second portion of duodenum

Spleen: Multiple hypodense subcentimeter splenic lesions.

Adrenals/Urinary Tract: Adrenal glands are within normal limits. The
kidneys show no hydronephrosis. Cyst upper pole left kidney
measuring 2.5 cm. Possible stones versus cortical calcifications in
the lower pole of the left kidney. 15 mm cyst lower pole of the
right kidney. Parapelvic cysts in the upper pole of the right
kidney. Bladder unremarkable.

Stomach/Bowel: Stomach is nonenlarged. No dilated small bowel. No
colon wall thickening. Normal appendix.

Vascular/Lymphatic: Extensive atherosclerotic calcifications of the
aorta. Aneurysmal dilatation of the right common iliac artery up to
2.9 cm. 16 mm peripancreatic lymph node. No significantly enlarged
pelvic nodes.

Reproductive: Enlarged prostate with mass effect on the posterior
bladder. Metallic densities at the prostate gland.

Other: No free air or free fluid.  Small fat in the umbilicus.

Musculoskeletal: 11 mm anterolisthesis of L4 on L5 with fusion of
the vertebral bodies. Bilateral pars defect at L4. Retrolisthesis of
L5 on S1 with degenerative changes. No suspicious bone lesion.
IMPRESSION: 1. Multiple subpleural bilateral lower lobe, right middle lobe and
lingular pulmonary nodules suspicious for respiratory infection or
atypical pneumonia. Metastatic nodules also possible given history.
Non-contrast chest CT at 3-6 months is recommended. If the nodules
are stable at time of repeat CT, then future CT at 18-24 months
(from today's scan) is considered optional for low-risk patients,
but is recommended for high-risk patients. This recommendation
follows the consensus statement: Guidelines for Management of
Incidental Pulmonary Nodules Detected on CT Images: From the
2. Pneumobilia with presence of biliary stent. Small amount of
debris or tissue in the distal portion of the stent. Ill-defined
hypodensity around the distal stent/head of pancreas, which may
relate to history of cholangiocarcinoma. Pancreatic duct is
enlarged.
3. Vague peripheral possibly branching hypodensities mostly in the
right hepatic lobe, could relate to distal ductal dilatation.
4. Subcentimeter splenic lesions too small to further characterize.
5. Aneurysmal dilatation of the iliac arteries, on the right up to
2.9 cm and on the left up to 1.9 cm.
6. Enlarged heterogenous prostate gland with mass effect on the
posterior bladder.
7. 11 mm anterolisthesis of L4 on L5 with bilateral pars defect at
L4
